# Patient Record
Sex: Male | Born: 1975 | Race: White | Hispanic: No | Marital: Single | Smoking: Current every day smoker
Health system: Southern US, Community
[De-identification: ages and names within clinical notes are randomized; demographics above are authoritative.]

## PROBLEM LIST (undated history)

## (undated) DIAGNOSIS — M549 Dorsalgia, unspecified: Secondary | ICD-10-CM

## (undated) HISTORY — PX: FRACTURE SURGERY: SHX138

---

## 2001-02-25 ENCOUNTER — Emergency Department (HOSPITAL_COMMUNITY): Admission: EM | Admit: 2001-02-25 | Discharge: 2001-02-25 | Payer: Self-pay | Admitting: Emergency Medicine

## 2001-03-09 ENCOUNTER — Emergency Department (HOSPITAL_COMMUNITY): Admission: EM | Admit: 2001-03-09 | Discharge: 2001-03-09 | Payer: Self-pay | Admitting: Internal Medicine

## 2002-06-01 ENCOUNTER — Encounter: Payer: Self-pay | Admitting: Emergency Medicine

## 2002-06-01 ENCOUNTER — Emergency Department (HOSPITAL_COMMUNITY): Admission: EM | Admit: 2002-06-01 | Discharge: 2002-06-01 | Payer: Self-pay | Admitting: Emergency Medicine

## 2003-02-23 ENCOUNTER — Emergency Department (HOSPITAL_COMMUNITY): Admission: EM | Admit: 2003-02-23 | Discharge: 2003-02-23 | Payer: Self-pay | Admitting: Emergency Medicine

## 2003-02-23 ENCOUNTER — Encounter: Payer: Self-pay | Admitting: *Deleted

## 2004-02-01 ENCOUNTER — Emergency Department (HOSPITAL_COMMUNITY): Admission: EM | Admit: 2004-02-01 | Discharge: 2004-02-01 | Payer: Self-pay | Admitting: Emergency Medicine

## 2004-02-14 ENCOUNTER — Emergency Department (HOSPITAL_COMMUNITY): Admission: EM | Admit: 2004-02-14 | Discharge: 2004-02-14 | Payer: Self-pay | Admitting: Emergency Medicine

## 2004-04-07 ENCOUNTER — Emergency Department (HOSPITAL_COMMUNITY): Admission: EM | Admit: 2004-04-07 | Discharge: 2004-04-07 | Payer: Self-pay | Admitting: Emergency Medicine

## 2004-04-12 ENCOUNTER — Emergency Department (HOSPITAL_COMMUNITY): Admission: EM | Admit: 2004-04-12 | Discharge: 2004-04-12 | Payer: Self-pay | Admitting: *Deleted

## 2004-04-14 ENCOUNTER — Ambulatory Visit (HOSPITAL_COMMUNITY): Admission: RE | Admit: 2004-04-14 | Discharge: 2004-04-14 | Payer: Self-pay | Admitting: Orthopedic Surgery

## 2004-04-19 ENCOUNTER — Emergency Department (HOSPITAL_COMMUNITY): Admission: EM | Admit: 2004-04-19 | Discharge: 2004-04-19 | Payer: Self-pay | Admitting: Emergency Medicine

## 2004-04-23 ENCOUNTER — Emergency Department (HOSPITAL_COMMUNITY): Admission: EM | Admit: 2004-04-23 | Discharge: 2004-04-23 | Payer: Self-pay | Admitting: Emergency Medicine

## 2004-04-25 ENCOUNTER — Encounter: Admission: RE | Admit: 2004-04-25 | Discharge: 2004-07-24 | Payer: Self-pay | Admitting: Orthopedic Surgery

## 2004-05-04 ENCOUNTER — Emergency Department (HOSPITAL_COMMUNITY): Admission: EM | Admit: 2004-05-04 | Discharge: 2004-05-04 | Payer: Self-pay | Admitting: Emergency Medicine

## 2004-06-30 ENCOUNTER — Emergency Department (HOSPITAL_COMMUNITY): Admission: EM | Admit: 2004-06-30 | Discharge: 2004-06-30 | Payer: Self-pay | Admitting: Emergency Medicine

## 2005-01-24 ENCOUNTER — Emergency Department (HOSPITAL_COMMUNITY): Admission: EM | Admit: 2005-01-24 | Discharge: 2005-01-24 | Payer: Self-pay | Admitting: Emergency Medicine

## 2005-01-30 ENCOUNTER — Emergency Department (HOSPITAL_COMMUNITY): Admission: EM | Admit: 2005-01-30 | Discharge: 2005-01-30 | Payer: Self-pay | Admitting: Emergency Medicine

## 2005-02-06 ENCOUNTER — Emergency Department (HOSPITAL_COMMUNITY): Admission: EM | Admit: 2005-02-06 | Discharge: 2005-02-06 | Payer: Self-pay | Admitting: Emergency Medicine

## 2006-09-25 ENCOUNTER — Emergency Department (HOSPITAL_COMMUNITY): Admission: EM | Admit: 2006-09-25 | Discharge: 2006-09-25 | Payer: Self-pay | Admitting: Emergency Medicine

## 2006-10-01 ENCOUNTER — Emergency Department (HOSPITAL_COMMUNITY): Admission: EM | Admit: 2006-10-01 | Discharge: 2006-10-01 | Payer: Self-pay | Admitting: Emergency Medicine

## 2006-10-11 ENCOUNTER — Emergency Department (HOSPITAL_COMMUNITY): Admission: EM | Admit: 2006-10-11 | Discharge: 2006-10-11 | Payer: Self-pay | Admitting: Emergency Medicine

## 2006-11-14 ENCOUNTER — Emergency Department (HOSPITAL_COMMUNITY): Admission: EM | Admit: 2006-11-14 | Discharge: 2006-11-14 | Payer: Self-pay | Admitting: Emergency Medicine

## 2008-03-01 ENCOUNTER — Emergency Department (HOSPITAL_COMMUNITY): Admission: EM | Admit: 2008-03-01 | Discharge: 2008-03-01 | Payer: Self-pay | Admitting: Emergency Medicine

## 2008-09-21 ENCOUNTER — Emergency Department (HOSPITAL_COMMUNITY): Admission: EM | Admit: 2008-09-21 | Discharge: 2008-09-21 | Payer: Self-pay | Admitting: Emergency Medicine

## 2009-07-05 ENCOUNTER — Emergency Department (HOSPITAL_COMMUNITY): Admission: EM | Admit: 2009-07-05 | Discharge: 2009-07-06 | Payer: Self-pay | Admitting: Emergency Medicine

## 2010-11-04 NOTE — Op Note (Signed)
Brandon Hunt, Brandon Hunt            ACCOUNT NO.:  [id number]   MEDICAL RECORD NO.:  [id number]          PATIENT TYPE:  AMB   LOCATION:  DAY                          FACILITY:  [facility]   PHYSICIAN:  Cindee Salt, M.D.       DATE OF BIRTH:  04/18/1976   DATE OF PROCEDURE:  04/14/2004  DATE OF DISCHARGE:                                 OPERATIVE REPORT   PREOPERATIVE DIAGNOSIS:  Dislocation, carpal metacarpal joint, right little  finger.   POSTOPERATIVE DIAGNOSIS:  Dislocation, carpal metacarpal joint, right little  finger.   OPERATION:  Closed reduction with percutaneous pinning, carpal metacarpal  joints, right ring and little fingers.   SURGEON:  Cindee Salt, M.D.   ASSISTANTCarolyne Fiscal.   ANESTHESIA:  General.   HISTORY:  The patient is a 35 year old male who suffered a dislocation to  the [facility] joints of his ring and little fingers, right hand, in a fall.  He is  brought to the operating room for closed reduction of percutaneous pinning.   PROCEDURE:  Patient is brought to the operating room.  A general anesthetic  was carried out without difficulty.  He was prepped using Duraprep in the  supine position.  The left arm was free.  The carpal metacarpal joints were  easily reduced without difficulty and pinned with two 3-5 K wires, one in  the ring and one in the little metacarpal into the hamate each.  AP lateral  and oblique x-rays revealed the joints to be well aligned and the pins in  good position.  These were capped.  A sterile dorsopalmar splint applied.  Patient tolerated the procedure well and was taken to the recovery room for  observation in satisfactory condition.  He is discharged home.  ____________  one week on Percocet.      GK/MEDQ  D:  04/14/2004  T:  04/14/2004  Job:  [id number]

## 2010-12-27 DIAGNOSIS — M542 Cervicalgia: Secondary | ICD-10-CM | POA: Insufficient documentation

## 2010-12-27 DIAGNOSIS — F17200 Nicotine dependence, unspecified, uncomplicated: Secondary | ICD-10-CM | POA: Insufficient documentation
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
## 2010-12-28 ENCOUNTER — Emergency Department (HOSPITAL_COMMUNITY)
Admission: EM | Admit: 2010-12-28 | Discharge: 2010-12-28 | Disposition: A | Discharge status: Home / Self Care | Payer: Self-pay | Attending: Emergency Medicine | Admitting: Emergency Medicine

## 2010-12-28 ENCOUNTER — Encounter (HOSPITAL_COMMUNITY): Payer: Self-pay

## 2010-12-28 ENCOUNTER — Emergency Department (HOSPITAL_COMMUNITY): Payer: Self-pay

## 2010-12-28 DIAGNOSIS — M542 Cervicalgia: Secondary | ICD-10-CM

## 2010-12-28 MED ORDER — IBUPROFEN 800 MG PO TABS
800.0000 mg | ORAL_TABLET | Freq: Once | ORAL | Status: AC
  Administered 2010-12-28: 800 mg via ORAL
  Filled 2010-12-28: qty 1

## 2010-12-28 MED ORDER — IBUPROFEN 800 MG PO TABS: 800.0000 mg | ORAL_TABLET | Freq: Three times a day (TID) | ORAL | Status: AC

## 2010-12-28 MED ORDER — HYDROCODONE-ACETAMINOPHEN 5-500 MG PO TABS: 1.0000 | ORAL_TABLET | ORAL | Status: AC | PRN

## 2010-12-28 MED ORDER — HYDROCODONE-ACETAMINOPHEN 5-325 MG PO TABS
2.0000 | ORAL_TABLET | Freq: Once | ORAL | Status: AC
  Administered 2010-12-28: 2 via ORAL
  Filled 2010-12-28: qty 2
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
## 2010-12-28 NOTE — ED Notes (Signed)
Sleeping soundly 

## 2010-12-28 NOTE — ED Notes (Signed)
Pt called to place and room and had removed c-collar while sitting in waiting room

## 2010-12-28 NOTE — ED Notes (Signed)
Review medication for reason to be given.  Review discharge instructions with understanding.  Family present to drive patient d/t pain med given.

## 2011-06-05 ENCOUNTER — Emergency Department (HOSPITAL_COMMUNITY)
Admission: EM | Admit: 2011-06-05 | Discharge: 2011-06-05 | Disposition: A | Discharge status: Home / Self Care | Payer: Self-pay | Attending: Emergency Medicine | Admitting: Emergency Medicine

## 2011-06-05 ENCOUNTER — Encounter (HOSPITAL_COMMUNITY): Payer: Self-pay

## 2011-06-05 DIAGNOSIS — M5431 Sciatica, right side: Secondary | ICD-10-CM

## 2011-06-05 DIAGNOSIS — M5430 Sciatica, unspecified side: Secondary | ICD-10-CM | POA: Insufficient documentation

## 2011-06-05 DIAGNOSIS — M545 Low back pain, unspecified: Secondary | ICD-10-CM | POA: Insufficient documentation

## 2011-06-05 DIAGNOSIS — M79609 Pain in unspecified limb: Secondary | ICD-10-CM | POA: Insufficient documentation

## 2011-06-05 DIAGNOSIS — F17200 Nicotine dependence, unspecified, uncomplicated: Secondary | ICD-10-CM | POA: Insufficient documentation

## 2011-06-05 HISTORY — DX: Dorsalgia, unspecified: M54.9

## 2011-06-05 MED ORDER — OXYCODONE-ACETAMINOPHEN 5-325 MG PO TABS
2.0000 | ORAL_TABLET | Freq: Once | ORAL | Status: AC
  Administered 2011-06-05: 2 via ORAL
  Filled 2011-06-05: qty 2

## 2011-06-05 MED ORDER — IBUPROFEN 800 MG PO TABS
800.0000 mg | ORAL_TABLET | Freq: Once | ORAL | Status: AC
  Administered 2011-06-05: 800 mg via ORAL
  Filled 2011-06-05: qty 1

## 2011-06-05 MED ORDER — IBUPROFEN 600 MG PO TABS: 600.0000 mg | ORAL_TABLET | Freq: Four times a day (QID) | ORAL | Status: AC | PRN

## 2011-06-05 MED ORDER — CYCLOBENZAPRINE HCL 5 MG PO TABS: 5.0000 mg | ORAL_TABLET | Freq: Three times a day (TID) | ORAL | Status: AC | PRN

## 2011-06-05 MED ORDER — OXYCODONE-ACETAMINOPHEN 5-325 MG PO TABS: 1.0000 | ORAL_TABLET | ORAL | Status: AC | PRN

## 2011-06-05 MED ORDER — IBUPROFEN 800 MG PO TABS
ORAL_TABLET | ORAL | Status: AC
  Filled 2011-06-05: qty 1

## 2011-06-05 NOTE — ED Notes (Signed)
Pt dropped Ibuprofen, replacement pulled on override.

## 2011-06-05 NOTE — ED Provider Notes (Signed)
History     CSN: [id number] Arrival date & time: 06/05/2011  6:07 PM   First MD Initiated Contact with Patient 06/05/11 1807      Chief Complaint  Patient presents with  . Back Pain    (Consider location/radiation/quality/duration/timing/severity/associated sxs/prior treatment) Patient is a 35 y.o. male presenting with back pain. The history is provided by the patient and the spouse.  Back Pain  The current episode started yesterday. The problem occurs constantly. The problem has been gradually worsening. The pain is associated with no known injury (But he does reports previous episodes of sciatica). The pain is present in the lumbar spine. The quality of the pain is described as shooting and stabbing. The pain radiates to the right thigh. The pain is at a severity of 10/10. The pain is severe. The symptoms are aggravated by bending, twisting and certain positions. The pain is worse during the day. Pertinent negatives include no chest pain, no fever, no numbness, no headaches, no abdominal pain, no abdominal swelling, no bowel incontinence, no perianal numbness, no dysuria, no paresthesias, no paresis, no tingling and no weakness. He has tried bed rest, analgesics and muscle relaxants for the symptoms. The treatment provided mild (fleeting relief from oxycodone and zanaflex borrowed from wife) relief.    Past Medical History  Diagnosis Date  . Back pain     History reviewed. No pertinent past surgical history.  No family history on file.  History  Substance Use Topics  . Smoking status: Current Everyday Smoker  . Smokeless tobacco: Not on file  . Alcohol Use: No      Review of Systems  Constitutional: Negative for fever.  HENT: Negative for congestion, sore throat and neck pain.   Eyes: Negative.   Respiratory: Negative for chest tightness and shortness of breath.   Cardiovascular: Negative for chest pain.  Gastrointestinal: Negative for nausea, abdominal pain and bowel  incontinence.  Genitourinary: Negative.  Negative for dysuria.  Musculoskeletal: Positive for back pain. Negative for joint swelling and arthralgias.  Skin: Negative.  Negative for rash and wound.  Neurological: Negative for dizziness, tingling, weakness, light-headedness, numbness, headaches and paresthesias.  Hematological: Negative.   Psychiatric/Behavioral: Negative.     Allergies  Review of patient's allergies indicates no known allergies.  Home Medications  No current outpatient prescriptions on file.  BP 114/66  Pulse 67  Temp(Src) 97.9 F (36.6 C) (Oral)  Resp 18  Ht 5\' 7"  (1.702 m)  Wt 140 lb (63.504 kg)  BMI 21.93 kg/m2  SpO2 99%  Physical Exam  Nursing note and vitals reviewed. Constitutional: He is oriented to person, place, and time. He appears well-developed and well-nourished.  HENT:  Head: Normocephalic.  Eyes: Conjunctivae are normal.  Neck: Normal range of motion. Neck supple.  Cardiovascular: Regular rhythm and intact distal pulses.        Pedal pulses normal.  Pulmonary/Chest: Effort normal. He has no wheezes.  Abdominal: Soft. Bowel sounds are normal. He exhibits no distension and no mass.  Musculoskeletal: Normal range of motion. He exhibits no edema.       Lumbar back: He exhibits tenderness. He exhibits no swelling, no edema and no spasm.  Neurological: He is alert and oriented to person, place, and time. He has normal strength. He displays no atrophy and no tremor. No cranial nerve deficit or sensory deficit. Gait normal.  Reflex Scores:      Patellar reflexes are 2+ on the right side and 2+ on the left side.  Achilles reflexes are 2+ on the right side and 2+ on the left side.      No strength deficit noted in hip and knee flexor and extensor muscle groups.  Ankle flexion and extension intact.  Skin: Skin is warm and dry.  Psychiatric: He has a normal mood and affect.    ED Course  Procedures (including critical care time)  Labs Reviewed  - No data to display No results found.   No diagnosis found.    MDM  Acute intermittent sciatica.  No neurologic findings suggestive of emergent neurologic process.        Candis Musa, PA 06/05/11 1944

## 2011-06-05 NOTE — ED Notes (Signed)
Pt reports has sciatica and it flares up intermittently.  Reports this time pain started yesterday.  States pain is in the center of his lower back nonradiating.

## 2011-06-07 NOTE — ED Provider Notes (Signed)
Medical screening examination/treatment/procedure(s) were performed by non-physician practitioner and as supervising physician I was immediately available for consultation/collaboration.   Shelda Jakes, MD 06/07/11 [phone]

## 2011-07-03 ENCOUNTER — Encounter (HOSPITAL_COMMUNITY): Payer: Self-pay | Admitting: *Deleted

## 2011-07-03 ENCOUNTER — Emergency Department (HOSPITAL_COMMUNITY)
Admission: EM | Admit: 2011-07-03 | Discharge: 2011-07-03 | Disposition: A | Discharge status: Home / Self Care | Payer: Self-pay | Attending: Emergency Medicine | Admitting: Emergency Medicine

## 2011-07-03 DIAGNOSIS — F17200 Nicotine dependence, unspecified, uncomplicated: Secondary | ICD-10-CM | POA: Insufficient documentation

## 2011-07-03 DIAGNOSIS — G8929 Other chronic pain: Secondary | ICD-10-CM | POA: Insufficient documentation

## 2011-07-03 DIAGNOSIS — M5380 Other specified dorsopathies, site unspecified: Secondary | ICD-10-CM | POA: Insufficient documentation

## 2011-07-03 DIAGNOSIS — M549 Dorsalgia, unspecified: Secondary | ICD-10-CM | POA: Insufficient documentation

## 2011-07-03 MED ORDER — DEXAMETHASONE 4 MG PO TABS: 4.0000 mg | ORAL_TABLET | Freq: Two times a day (BID) | ORAL | Status: AC

## 2011-07-03 MED ORDER — TIZANIDINE HCL 4 MG PO CAPS: 4.0000 mg | ORAL_CAPSULE | Freq: Three times a day (TID) | ORAL | Status: DC

## 2011-07-03 MED ORDER — HYDROCODONE-ACETAMINOPHEN 5-325 MG PO TABS: ORAL_TABLET | ORAL | Status: DC

## 2011-07-03 NOTE — ED Notes (Signed)
C/o lower back pain (chronic); states was seen 1 month ago here for same; states the pain returned after he ran out of pain medications; reports has appt to see specialist, but appt is not until next month.

## 2011-07-03 NOTE — ED Provider Notes (Signed)
History     CSN: [id number]  Arrival date & time 07/03/11  1637   First MD Initiated Contact with Patient 07/03/11 1650      Chief Complaint  Patient presents with  . Back Pain    (Consider location/radiation/quality/duration/timing/severity/associated sxs/prior treatment) HPI Comments:   Patient states he has chronic low back pain. He is scheduled to see a back and pain specialist, but could not be scheduled until next month. The patient was seen here less than a month ago for the same back problem. The patient ran out of medications and has not seen a primary care physician and he presents now for assistance with his back pain. No recent injury or other complication reported. Patient denies loss of bowel or bladder function.  Patient is a 36 y.o. male presenting with back pain. The history is provided by the patient.  Back Pain  Pertinent negatives include no chest pain, no abdominal pain and no dysuria.    Past Medical History  Diagnosis Date  . Back pain     History reviewed. No pertinent past surgical history.  No family history on file.  History  Substance Use Topics  . Smoking status: Current Everyday Smoker -- 1.0 packs/day    Types: Cigarettes  . Smokeless tobacco: Not on file  . Alcohol Use: No      Review of Systems  Constitutional: Negative for activity change.       All ROS Neg except as noted in HPI  HENT: Negative for nosebleeds and neck pain.   Eyes: Negative for photophobia and discharge.  Respiratory: Negative for cough, shortness of breath and wheezing.   Cardiovascular: Negative for chest pain and palpitations.  Gastrointestinal: Negative for abdominal pain and blood in stool.  Genitourinary: Negative for dysuria, frequency and hematuria.  Musculoskeletal: Positive for back pain. Negative for arthralgias.  Skin: Negative.   Neurological: Negative for dizziness, seizures and speech difficulty.  Psychiatric/Behavioral: Negative for  hallucinations and confusion.    Allergies  Review of patient's allergies indicates no known allergies.  Home Medications   Current Outpatient Rx  Name Route Sig Dispense Refill  . DEXAMETHASONE 4 MG PO TABS Oral Take 1 tablet (4 mg total) by mouth 2 (two) times daily with a meal. 12 tablet 0  . HYDROCODONE-ACETAMINOPHEN 5-325 MG PO TABS  1 or 2 po q4h prn pain 24 tablet 0  . TIZANIDINE HCL 4 MG PO CAPS Oral Take 1 capsule (4 mg total) by mouth 3 (three) times daily. 21 capsule 0    BP 127/82  Pulse 84  Temp(Src) 98.4 F (36.9 C) (Oral)  Resp 18  Ht 5\' 7"  (1.702 m)  Wt 138 lb (62.596 kg)  BMI 21.61 kg/m2  SpO2 100%  Physical Exam  Nursing note and vitals reviewed. Constitutional: He is oriented to person, place, and time. He appears well-developed and well-nourished.  Non-toxic appearance.  HENT:  Head: Normocephalic.  Right Ear: Tympanic membrane and external ear normal.  Left Ear: Tympanic membrane and external ear normal.  Eyes: EOM and lids are normal. Pupils are equal, round, and reactive to light.  Neck: Normal range of motion. Neck supple. Carotid bruit is not present.  Cardiovascular: Normal rate, regular rhythm, normal heart sounds, intact distal pulses and normal pulses.   Pulmonary/Chest: Breath sounds normal. No respiratory distress.  Abdominal: Soft. Bowel sounds are normal. There is no tenderness. There is no guarding.  Musculoskeletal: Normal range of motion.       Lower  lumbar area pain to palpation and attempted range of motion  Lymphadenopathy:       Head (right side): No submandibular adenopathy present.       Head (left side): No submandibular adenopathy present.    He has no cervical adenopathy.  Neurological: He is alert and oriented to person, place, and time. He has normal strength. No cranial nerve deficit or sensory deficit.  Skin: Skin is warm and dry.  Psychiatric: He has a normal mood and affect. His speech is normal.    ED Course    Procedures (including critical care time)  Labs Reviewed - No data to display No results found.   1. Back pain, chronic       MDM  I have reviewed nursing notes, vital signs, and all appropriate lab and imaging results for this patient. History and examination suggests patient having an exacerbation of chronic back pain. Prescription for Decadron 4 mg twice a day, Norco every 4 hours as needed for pain, and Zanaflex 4 mg 3 times a day for spasm given to the patient.       Kathie Dike, [place] 07/03/11 2148

## 2011-07-03 NOTE — ED Notes (Signed)
MD at bedside. 

## 2011-07-03 NOTE — ED Provider Notes (Signed)
Medical screening examination/treatment/procedure(s) were performed by non-physician practitioner and as supervising physician I was immediately available for consultation/collaboration.  Corrisa Gibby, MD 07/03/11 2242 

## 2011-11-06 ENCOUNTER — Encounter (HOSPITAL_COMMUNITY): Payer: Self-pay | Admitting: *Deleted

## 2011-11-06 ENCOUNTER — Emergency Department (HOSPITAL_COMMUNITY)
Admission: EM | Admit: 2011-11-06 | Discharge: 2011-11-06 | Disposition: A | Discharge status: Home / Self Care | Payer: Self-pay | Attending: Emergency Medicine | Admitting: Emergency Medicine

## 2011-11-06 DIAGNOSIS — G8929 Other chronic pain: Secondary | ICD-10-CM | POA: Insufficient documentation

## 2011-11-06 DIAGNOSIS — F17200 Nicotine dependence, unspecified, uncomplicated: Secondary | ICD-10-CM | POA: Insufficient documentation

## 2011-11-06 DIAGNOSIS — M5430 Sciatica, unspecified side: Secondary | ICD-10-CM | POA: Insufficient documentation

## 2011-11-06 MED ORDER — DEXAMETHASONE SODIUM PHOSPHATE 4 MG/ML IJ SOLN
8.0000 mg | Freq: Once | INTRAMUSCULAR | Status: AC
  Administered 2011-11-06: 8 mg via INTRAMUSCULAR
  Filled 2011-11-06: qty 2

## 2011-11-06 MED ORDER — MORPHINE SULFATE 4 MG/ML IJ SOLN
8.0000 mg | Freq: Once | INTRAMUSCULAR | Status: AC
  Administered 2011-11-06: 8 mg via INTRAMUSCULAR
  Filled 2011-11-06: qty 2

## 2011-11-06 MED ORDER — DEXAMETHASONE 4 MG PO TABS: ORAL_TABLET | ORAL | Status: AC

## 2011-11-06 MED ORDER — ONDANSETRON 4 MG PO TBDP
4.0000 mg | ORAL_TABLET | Freq: Once | ORAL | Status: AC
  Administered 2011-11-06: 4 mg via ORAL
  Filled 2011-11-06: qty 1

## 2011-11-06 MED ORDER — METHOCARBAMOL 500 MG PO TABS
1000.0000 mg | ORAL_TABLET | Freq: Once | ORAL | Status: AC
  Administered 2011-11-06: 1000 mg via ORAL
  Filled 2011-11-06: qty 2

## 2011-11-06 MED ORDER — OXYCODONE-ACETAMINOPHEN 5-325 MG PO TABS: 1.0000 | ORAL_TABLET | Freq: Four times a day (QID) | ORAL | Status: AC | PRN

## 2011-11-06 MED ORDER — BACLOFEN 10 MG PO TABS: 10.0000 mg | ORAL_TABLET | Freq: Three times a day (TID) | ORAL | Status: AC

## 2011-11-06 MED ORDER — MORPHINE SULFATE 2 MG/ML IJ SOLN: 8.0000 mg | Freq: Once | INTRAMUSCULAR | Status: DC

## 2011-11-06 NOTE — ED Notes (Signed)
Low back pain, no injury

## 2011-11-06 NOTE — ED Notes (Signed)
Pt with no adverse effects from injections. Able to ambulate to bathroom. Left with spouse.

## 2011-11-06 NOTE — Discharge Instructions (Signed)
Sciatica  Sciatica is a condition often seen in patients with disk disease of the lower back. Pressure on the sciatica nerve causes pain to radiate from the lower back or buttock down the leg.   CAUSES   It results from pressure on nerve roots coming out of the spine. This is often the result of a disc that deteriorates and pushes to one side. Often there is a history of back problems.   TREATMENT   In most cases sciatica improves greatly with conservative treatment (treatment that does not involve surgery). Most patients are completely better after 2-4 weeks of bed rest and other supportive care. Bed rest reduces the disc pressure greatly. Sitting is the worst position. When sitting pressure on the disc is over 5 times greater than it is while lying down. Avoid:   Bending.   Lifting.   All other activities which make the problem worse.  After the pain improves, you may continue with normal activity. Take brief periods for bed rest throughout the day until you are back to normal.  Only take over-the-counter or prescription medicines for pain, discomfort, or fever as directed by your caregiver. Muscle relaxants may help by relieving spasm and providing mild sedation. Cold or heat therapy and massage may also give significant relief. Spinal manipulation is not recommended because it can increase the degree of disc protrusion. Traction can be used in severe cases. Surgery is reserved for patients who:   Do not improve within the first months of conservative treatment.   Have signs of severe nerve root pressure.  See your doctor for follow up care as recommended. A program for back injury rehabilitation with stretching and strengthening exercises is an important part of healing.   SEEK MEDICAL CARE IF:    You notice increased pain.   You notice weakness.   You have numbness in your legs.   You have any difficulty with bladder or bowel control.  Document Released: 07/13/2004 Document Revised: 05/25/2011 Document  Reviewed: 06/05/2005  [employer] Patient Information 2012 [employer].

## 2011-11-06 NOTE — ED Provider Notes (Signed)
History     CSN: [id number]  Arrival date & time 11/06/11  1653   First MD Initiated Contact with Patient 11/06/11 1800      Chief Complaint  Patient presents with  . Back Pain    (Consider location/radiation/quality/duration/timing/severity/associated sxs/prior treatment) The history is provided by the patient.    Past Medical History  Diagnosis Date  . Back pain     Past Surgical History  Procedure Date  . Fracture surgery     History reviewed. No pertinent family history.  History  Substance Use Topics  . Smoking status: Current Everyday Smoker -- 1.0 packs/day    Types: Cigarettes  . Smokeless tobacco: Not on file  . Alcohol Use: Yes      Review of Systems  Constitutional: Negative for activity change.       All ROS Neg except as noted in HPI  HENT: Negative for nosebleeds and neck pain.   Eyes: Negative for photophobia and discharge.  Respiratory: Negative for cough, shortness of breath and wheezing.   Cardiovascular: Negative for chest pain and palpitations.  Gastrointestinal: Negative for abdominal pain and blood in stool.  Genitourinary: Negative for dysuria, frequency and hematuria.  Musculoskeletal: Negative for back pain and arthralgias.  Skin: Negative.   Neurological: Negative for dizziness, seizures and speech difficulty.  Psychiatric/Behavioral: Negative for hallucinations and confusion.    Allergies  Review of patient's allergies indicates no known allergies.  Home Medications   Current Outpatient Rx  Name Route Sig Dispense Refill  . IBUPROFEN 200 MG PO TABS Oral Take 1,600 mg by mouth as needed. For pain      BP 114/77  Pulse 72  Temp(Src) 97.9 F (36.6 C) (Oral)  Resp 20  Ht 5\' 7"  (1.702 m)  Wt 135 lb (61.236 kg)  BMI 21.14 kg/m2  SpO2 100%  Physical Exam  Nursing note and vitals reviewed. Constitutional: He is oriented to person, place, and time. He appears well-developed and well-nourished.  Non-toxic appearance.    HENT:  Head: Normocephalic.  Right Ear: Tympanic membrane and external ear normal.  Left Ear: Tympanic membrane and external ear normal.  Eyes: EOM and lids are normal. Pupils are equal, round, and reactive to light.  Neck: Normal range of motion. Neck supple. Carotid bruit is not present.  Cardiovascular: Normal rate, regular rhythm, normal heart sounds, intact distal pulses and normal pulses.   Pulmonary/Chest: Breath sounds normal. No respiratory distress.  Abdominal: Soft. Bowel sounds are normal. There is no tenderness. There is no guarding.  Musculoskeletal:       Lower lumbar area pain to palpation and with attempted ROM of the lower back. Back pain stimulated with leg movement.  Lymphadenopathy:       Head (right side): No submandibular adenopathy present.       Head (left side): No submandibular adenopathy present.    He has no cervical adenopathy.  Neurological: He is alert and oriented to person, place, and time. He has normal strength. No cranial nerve deficit or sensory deficit. He exhibits normal muscle tone. Coordination normal.  Skin: Skin is warm and dry.  Psychiatric: He has a normal mood and affect. His speech is normal.    ED Course  Procedures (including critical care time)  Labs Reviewed - No data to display No results found.   No diagnosis found.    MDM  I have reviewed nursing notes, vital signs, and all appropriate lab and imaging results for this patient. Pt has chronic  pain with sciatica. He does not have a primary MD. Rx for blacofen, decadron, and percocet  # 15 given to the patient.       Kathie Dike, [place] 11/06/11 1901

## 2011-11-06 NOTE — ED Provider Notes (Signed)
Medical screening examination/treatment/procedure(s) were performed by non-physician practitioner and as supervising physician I was immediately available for consultation/collaboration. Devoria Albe, MD, Armando Gang   Ward Givens, MD 11/06/11 [phone]

## 2011-12-14 ENCOUNTER — Emergency Department (HOSPITAL_COMMUNITY)
Admission: EM | Admit: 2011-12-14 | Discharge: 2011-12-14 | Disposition: A | Discharge status: Home / Self Care | Payer: Self-pay | Attending: Emergency Medicine | Admitting: Emergency Medicine

## 2011-12-14 ENCOUNTER — Encounter (HOSPITAL_COMMUNITY): Payer: Self-pay

## 2011-12-14 DIAGNOSIS — M545 Low back pain, unspecified: Secondary | ICD-10-CM | POA: Insufficient documentation

## 2011-12-14 DIAGNOSIS — F17200 Nicotine dependence, unspecified, uncomplicated: Secondary | ICD-10-CM | POA: Insufficient documentation

## 2011-12-14 DIAGNOSIS — M79606 Pain in leg, unspecified: Secondary | ICD-10-CM

## 2011-12-14 MED ORDER — METHYLPREDNISOLONE SODIUM SUCC 125 MG IJ SOLR
125.0000 mg | Freq: Once | INTRAMUSCULAR | Status: AC
  Administered 2011-12-14: 125 mg via INTRAMUSCULAR
  Filled 2011-12-14: qty 2

## 2011-12-14 MED ORDER — CYCLOBENZAPRINE HCL 10 MG PO TABS: 10.0000 mg | ORAL_TABLET | Freq: Three times a day (TID) | ORAL | Status: AC | PRN

## 2011-12-14 MED ORDER — HYDROMORPHONE HCL PF 1 MG/ML IJ SOLN
1.0000 mg | Freq: Once | INTRAMUSCULAR | Status: AC
  Administered 2011-12-14: 1 mg via INTRAMUSCULAR
  Filled 2011-12-14: qty 1

## 2011-12-14 MED ORDER — NAPROXEN 500 MG PO TABS: 500.0000 mg | ORAL_TABLET | Freq: Two times a day (BID) | ORAL | Status: DC

## 2011-12-14 MED ORDER — HYDROCODONE-ACETAMINOPHEN 5-325 MG PO TABS: 1.0000 | ORAL_TABLET | Freq: Four times a day (QID) | ORAL | Status: AC | PRN

## 2011-12-14 MED ORDER — PREDNISONE 10 MG PO TABS: 20.0000 mg | ORAL_TABLET | Freq: Every day | ORAL | Status: DC

## 2011-12-14 NOTE — ED Notes (Signed)
Right flank and lower leg and back pain, onset last pm history of sciatica and feels the same.

## 2011-12-14 NOTE — Discharge Instructions (Signed)
Follow up with your md next week if not improving °

## 2011-12-14 NOTE — ED Provider Notes (Signed)
History   This chart was scribed for Benny Lennert, MD by Sofie Rower. The patient was seen in room TR08C/TR08C and the patient's care was started at 5:58 PM     CSN: [id number]  Arrival date & time 12/14/11  1624   None     Chief Complaint  Patient presents with  . Sciatica    (Consider location/radiation/quality/duration/timing/severity/associated sxs/prior treatment) Patient is a 36 y.o. male presenting with back pain. The history is provided by the patient. No language interpreter was used.  Back Pain  This is a recurrent problem. The current episode started yesterday. The problem occurs rarely. The problem has not changed since onset.The pain is associated with lifting heavy objects. The pain is present in the lumbar spine. The quality of the pain is described as shooting. The pain radiates to the right thigh. The pain is moderate. The symptoms are aggravated by certain positions. The pain is the same all the time. Associated symptoms include leg pain. Pertinent negatives include no chest pain, no headaches, no abdominal pain and no weakness. He has tried nothing for the symptoms. The treatment provided no relief.    Past Medical History  Diagnosis Date  . Back pain     Past Surgical History  Procedure Date  . Fracture surgery      History  Substance Use Topics  . Smoking status: Current Everyday Smoker -- 1.0 packs/day    Types: Cigarettes  . Smokeless tobacco: Not on file  . Alcohol Use: Yes      Review of Systems  Constitutional: Negative for fatigue.  HENT: Negative for congestion, sinus pressure and ear discharge.   Eyes: Negative for discharge.  Respiratory: Negative for cough.   Cardiovascular: Negative for chest pain.  Gastrointestinal: Negative for abdominal pain and diarrhea.  Genitourinary: Negative for frequency and hematuria.  Musculoskeletal: Positive for back pain.  Skin: Negative for rash.  Neurological: Negative for seizures, weakness and  headaches.  Hematological: Negative.   Psychiatric/Behavioral: Negative for hallucinations.    Allergies  Review of patient's allergies indicates no known allergies.  Home Medications   Current Outpatient Rx  Name Route Sig Dispense Refill  . IBUPROFEN 200 MG PO TABS Oral Take 800 mg by mouth as needed. For pain      BP 124/85  Pulse 74  Temp 98.3 F (36.8 C) (Oral)  Resp 19  SpO2 98%  Physical Exam  Nursing note and vitals reviewed. Constitutional: He is oriented to person, place, and time. He appears well-developed.  HENT:  Head: Normocephalic.  Eyes: Conjunctivae are normal.  Neck: No tracheal deviation present.  Cardiovascular:  No murmur heard. Musculoskeletal: Normal range of motion.  Neurological: He is oriented to person, place, and time.  Skin: Skin is warm.  Psychiatric: He has a normal mood and affect.    ED Course  Procedures (including critical care time)  DIAGNOSTIC STUDIES: Oxygen Saturation is 98% on room air, normal by my interpretation.    COORDINATION OF CARE:  6:00PM- EDP at bedside discusses treatment plan concerning evaluation of x-rays.   6:06PM- EDP at bedside discusses treatment plan concerning pain management.    Labs Reviewed - No data to display No results found.   No diagnosis found.    MDM      The chart was scribed for me under my direct supervision.  I personally performed the history, physical, and medical decision making and all procedures in the evaluation of this patient.Jomarie Longs  Purnell Shoemaker, MD 12/14/11 1812

## 2012-01-10 ENCOUNTER — Encounter (HOSPITAL_COMMUNITY): Payer: Self-pay | Admitting: Emergency Medicine

## 2012-01-10 ENCOUNTER — Emergency Department (HOSPITAL_COMMUNITY)
Admission: EM | Admit: 2012-01-10 | Discharge: 2012-01-10 | Disposition: A | Discharge status: Home / Self Care | Payer: Self-pay | Attending: Emergency Medicine | Admitting: Emergency Medicine

## 2012-01-10 DIAGNOSIS — K029 Dental caries, unspecified: Secondary | ICD-10-CM | POA: Insufficient documentation

## 2012-01-10 DIAGNOSIS — K089 Disorder of teeth and supporting structures, unspecified: Secondary | ICD-10-CM | POA: Insufficient documentation

## 2012-01-10 DIAGNOSIS — F17200 Nicotine dependence, unspecified, uncomplicated: Secondary | ICD-10-CM | POA: Insufficient documentation

## 2012-01-10 MED ORDER — PENICILLIN V POTASSIUM 500 MG PO TABS: 500.0000 mg | ORAL_TABLET | Freq: Four times a day (QID) | ORAL | Status: AC

## 2012-01-10 MED ORDER — OXYCODONE-ACETAMINOPHEN 5-325 MG PO TABS
1.0000 | ORAL_TABLET | Freq: Once | ORAL | Status: AC
  Administered 2012-01-10: 1 via ORAL
  Filled 2012-01-10: qty 1

## 2012-01-10 MED ORDER — PENICILLIN V POTASSIUM 250 MG PO TABS
500.0000 mg | ORAL_TABLET | Freq: Once | ORAL | Status: AC
Start: 2012-01-10 — End: 2012-01-10
  Administered 2012-01-10: 500 mg via ORAL
  Filled 2012-01-10: qty 2

## 2012-01-10 MED ORDER — OXYCODONE-ACETAMINOPHEN 5-325 MG PO TABS: 2.0000 | ORAL_TABLET | ORAL | Status: AC | PRN

## 2012-01-10 NOTE — ED Provider Notes (Signed)
History   This chart was scribed for Brandon Guppy, MD by Charolett Bumpers . The patient was seen in room TR09C/TR09C. Patient's care was started at 1805.    CSN: [id number]  Arrival date & time 01/10/12  1726   First MD Initiated Contact with Patient 01/10/12 1805      Chief Complaint  Patient presents with  . Dental Pain    (Consider location/radiation/quality/duration/timing/severity/associated sxs/prior treatment) HPI ROBBIN Hunt is a 36 y.o. male who presents to the Emergency Department complaining of constant, moderate dental pain for the past week. Pt reports that his dental pain is located on the left side of his mouth. Pt reports that he has several cavities and broken teeth and "holes in teeth". Pt denies any fevers, n/v. Pt reports associated facial pain and headache. Pt denies any prior medical problems. Pt reports that he smokes and occasional alcohol use.   Past Medical History  Diagnosis Date  . Back pain     Past Surgical History  Procedure Date  . Fracture surgery     No family history on file.  History  Substance Use Topics  . Smoking status: Current Everyday Smoker -- 1.0 packs/day    Types: Cigarettes  . Smokeless tobacco: Not on file  . Alcohol Use: Yes      Review of Systems  Constitutional: Negative for fever and chills.  HENT: Positive for dental problem.   Respiratory: Negative for shortness of breath.   Gastrointestinal: Negative for nausea and vomiting.  Neurological: Positive for headaches. Negative for weakness.    Allergies  Review of patient's allergies indicates no known allergies.  Home Medications   Current Outpatient Rx  Name Route Sig Dispense Refill  . IBUPROFEN 200 MG PO TABS Oral Take 800 mg by mouth as needed. For pain      BP 116/75  Pulse 53  Temp 98 F (36.7 C) (Oral)  Resp 16  SpO2 98%  Physical Exam  Nursing note and vitals reviewed. Constitutional: He is oriented to person, place, and  time. He appears well-developed and well-nourished. No distress.  HENT:  Head: Normocephalic and atraumatic.  Mouth/Throat: Oropharynx is clear and moist. Dental caries present.       Severe, rotten dental caries throughout.   Eyes: EOM are normal.  Neck: Neck supple. No tracheal deviation present.  Cardiovascular: Normal rate.   Pulmonary/Chest: Effort normal. No respiratory distress.  Musculoskeletal: Normal range of motion.  Neurological: He is alert and oriented to person, place, and time.  Skin: Skin is warm and dry.  Psychiatric: He has a normal mood and affect. His behavior is normal.    ED Course  Procedures (including critical care time)  DIAGNOSTIC STUDIES: Oxygen Saturation is 98% on room air, normal by my interpretation.    COORDINATION OF CARE:   18:54-Discussed planned course of treatment with the patient including pain medication, abx and f/u with dentist on call, who is agreeable at this time.   19:00-Medication Orders: Oxycodone-acetaminophen (Percocet/Roxicet) 5-325 mg per tablet 1 tablet-once; Penicillin V potassium (Veetid) tablet 500 mg-once.    Labs Reviewed - No data to display No results found.   No diagnosis found.    MDM   Dental caries   I personally performed the services described in this documentation, which was scribed in my presence. The recorded information has been reviewed and considered.    Brandon Guppy, MD 01/10/12 1910

## 2012-01-10 NOTE — ED Notes (Signed)
Pt d/c home in NAD. Pt voiced understanding of d/c instructions and need for follow up care. Pt instructed not to drive after taking percocet

## 2012-01-10 NOTE — ED Notes (Signed)
Pt states "I dont have no insurance and cant see no dentist and I have a hole in my tooth." No swelling noted to face. Pt states both bottom and top teeth hurt on left side.

## 2012-01-10 NOTE — ED Notes (Signed)
C/o L sided toothache x 1 week.

## 2012-08-11 ENCOUNTER — Emergency Department (HOSPITAL_COMMUNITY)
Admission: EM | Admit: 2012-08-11 | Discharge: 2012-08-11 | Disposition: A | Discharge status: Home / Self Care | Payer: Self-pay | Attending: Emergency Medicine | Admitting: Emergency Medicine

## 2012-08-11 ENCOUNTER — Encounter (HOSPITAL_COMMUNITY): Payer: Self-pay | Admitting: *Deleted

## 2012-08-11 DIAGNOSIS — S61219A Laceration without foreign body of unspecified finger without damage to nail, initial encounter: Secondary | ICD-10-CM

## 2012-08-11 DIAGNOSIS — Y9389 Activity, other specified: Secondary | ICD-10-CM | POA: Insufficient documentation

## 2012-08-11 DIAGNOSIS — Y929 Unspecified place or not applicable: Secondary | ICD-10-CM | POA: Insufficient documentation

## 2012-08-11 DIAGNOSIS — Z23 Encounter for immunization: Secondary | ICD-10-CM | POA: Insufficient documentation

## 2012-08-11 DIAGNOSIS — F17200 Nicotine dependence, unspecified, uncomplicated: Secondary | ICD-10-CM | POA: Insufficient documentation

## 2012-08-11 DIAGNOSIS — S61209A Unspecified open wound of unspecified finger without damage to nail, initial encounter: Secondary | ICD-10-CM | POA: Insufficient documentation

## 2012-08-11 DIAGNOSIS — W268XXA Contact with other sharp object(s), not elsewhere classified, initial encounter: Secondary | ICD-10-CM | POA: Insufficient documentation

## 2012-08-11 MED ORDER — HYDROCODONE-ACETAMINOPHEN 5-325 MG PO TABS
2.0000 | ORAL_TABLET | Freq: Once | ORAL | Status: AC
  Administered 2012-08-11: 2 via ORAL
  Filled 2012-08-11: qty 2

## 2012-08-11 MED ORDER — TETANUS-DIPHTH-ACELL PERTUSSIS 5-2.5-18.5 LF-MCG/0.5 IM SUSP
0.5000 mL | Freq: Once | INTRAMUSCULAR | Status: AC
  Administered 2012-08-11: 0.5 mL via INTRAMUSCULAR
  Filled 2012-08-11: qty 0.5

## 2012-08-11 NOTE — ED Provider Notes (Signed)
Medical screening examination/treatment/procedure(s) were performed by non-physician practitioner and as supervising physician I was immediately available for consultation/collaboration.   Sandip Power L Armel Rabbani, MD 08/11/12 2240 

## 2012-08-11 NOTE — ED Notes (Signed)
Pt has laceration to right index finger. Pt states he cut it on a vegetable can.

## 2012-08-11 NOTE — ED Notes (Signed)
Pt presents with small laceration right index finger. Bleeding controlled on arrival.

## 2012-08-11 NOTE — ED Notes (Signed)
Pt not in waiting room

## 2012-08-11 NOTE — ED Provider Notes (Signed)
History     CSN: [id number]  Arrival date & time 08/11/12  1949   First MD Initiated Contact with Patient 08/11/12 2048      Chief Complaint  Patient presents with  . Extremity Laceration    (Consider location/radiation/quality/duration/timing/severity/associated sxs/prior treatment) Patient is a 37 y.o. male presenting with skin laceration. The history is provided by the patient. No language interpreter was used.  Laceration Location:  Finger Depth:  Cutaneous Time since incident:  2 hours Laceration mechanism:  Metal edge Pain details:    Severity:  Mild Foreign body present:  Metal Tetanus status:  Out of date Pt cut finger on a can.   Pt complains of a laceration to finger  Past Medical History  Diagnosis Date  . Back pain     Past Surgical History  Procedure Laterality Date  . Fracture surgery      History reviewed. No pertinent family history.  History  Substance Use Topics  . Smoking status: Current Every Day Smoker -- 1.00 packs/day    Types: Cigarettes  . Smokeless tobacco: Not on file  . Alcohol Use: No      Review of Systems  Skin: Positive for wound.  All other systems reviewed and are negative.    Allergies  Review of patient's allergies indicates no known allergies.  Home Medications  No current outpatient prescriptions on file.  BP 112/58  Pulse 62  Temp(Src) 98.2 F (36.8 C) (Oral)  Resp 20  Ht 5\' 7"  (1.702 m)  Wt 140 lb (63.504 kg)  BMI 21.92 kg/m2  SpO2 99%  Physical Exam  Nursing note and vitals reviewed. Constitutional: He is oriented to person, place, and time. He appears well-developed and well-nourished.  Musculoskeletal: He exhibits tenderness.  Neurological: He is alert and oriented to person, place, and time. He has normal reflexes.  Skin: Skin is warm.  1cm laceration distal right index finger  Psychiatric: He has a normal mood and affect.    ED Course  LACERATION REPAIR Date/Time: 08/11/2012 9:08  PM Performed by: Cheron Schaumann K Authorized by: Elson Areas Body area: upper extremity Location details: right index finger Laceration length: 1 cm Foreign bodies: no foreign bodies Tendon involvement: none Nerve involvement: none Skin closure: glue Patient tolerance: Patient tolerated the procedure well with no immediate complications. Comments: Superficial laceration,     (including critical care time)  Labs Reviewed - No data to display No results found.   1. Laceration of finger       MDM  Pt complains of pain.   Pt given 2 hydrocodone here        Lonia Skinner [place], [place] 08/11/12 2110

## 2013-01-23 ENCOUNTER — Emergency Department (HOSPITAL_COMMUNITY)
Admission: EM | Admit: 2013-01-23 | Discharge: 2013-01-23 | Disposition: A | Discharge status: Home / Self Care | Payer: Self-pay | Attending: Emergency Medicine | Admitting: Emergency Medicine

## 2013-01-23 ENCOUNTER — Encounter (HOSPITAL_COMMUNITY): Payer: Self-pay | Admitting: Emergency Medicine

## 2013-01-23 DIAGNOSIS — K0889 Other specified disorders of teeth and supporting structures: Secondary | ICD-10-CM

## 2013-01-23 DIAGNOSIS — K089 Disorder of teeth and supporting structures, unspecified: Secondary | ICD-10-CM | POA: Insufficient documentation

## 2013-01-23 DIAGNOSIS — F17200 Nicotine dependence, unspecified, uncomplicated: Secondary | ICD-10-CM | POA: Insufficient documentation

## 2013-01-23 DIAGNOSIS — R51 Headache: Secondary | ICD-10-CM | POA: Insufficient documentation

## 2013-01-23 DIAGNOSIS — K029 Dental caries, unspecified: Secondary | ICD-10-CM | POA: Insufficient documentation

## 2013-01-23 MED ORDER — HYDROCODONE-ACETAMINOPHEN 5-325 MG PO TABS: ORAL_TABLET | ORAL | Status: DC

## 2013-01-23 MED ORDER — HYDROCODONE-ACETAMINOPHEN 5-325 MG PO TABS
1.0000 | ORAL_TABLET | Freq: Once | ORAL | Status: AC
  Administered 2013-01-23: 1 via ORAL
  Filled 2013-01-23: qty 1

## 2013-01-23 MED ORDER — AMOXICILLIN 250 MG PO CAPS
500.0000 mg | ORAL_CAPSULE | Freq: Once | ORAL | Status: AC
  Administered 2013-01-23: 500 mg via ORAL
  Filled 2013-01-23: qty 2

## 2013-01-23 MED ORDER — AMOXICILLIN 500 MG PO CAPS: 500.0000 mg | ORAL_CAPSULE | Freq: Three times a day (TID) | ORAL | Status: DC

## 2013-01-23 NOTE — ED Notes (Signed)
Patient complaining of lower right dental pain x 2 days.  

## 2013-01-23 NOTE — ED Provider Notes (Signed)
CSN: [id number]     Arrival date & time 01/23/13  2138 History     First MD Initiated Contact with Patient 01/23/13 2145     Chief Complaint  Patient presents with  . Dental Pain   (Consider location/radiation/quality/duration/timing/severity/associated sxs/prior Treatment) Patient is a 37 y.o. male presenting with tooth pain. The history is provided by the patient.  Dental Pain Location:  Lower Lower teeth location:  29/RL 2nd bicuspid Quality:  Throbbing and sharp Severity:  Moderate Onset quality:  Gradual Duration:  2 days Timing:  Constant Progression:  Worsening Chronicity:  New Context: dental caries and poor dentition   Context: not trauma   Relieved by:  Nothing Worsened by:  Hot food/drink and cold food/drink Ineffective treatments:  Topical anesthetic gel Associated symptoms: facial pain   Associated symptoms: no congestion, no difficulty swallowing, no drooling, no facial swelling, no fever, no gum swelling, no headaches, no neck pain, no neck swelling, no oral bleeding, no oral lesions and no trismus   Risk factors: lack of dental care, periodontal disease and smoking     Past Medical History  Diagnosis Date  . Back pain    Past Surgical History  Procedure Laterality Date  . Fracture surgery     History reviewed. No pertinent family history. History  Substance Use Topics  . Smoking status: Current Every Day Smoker -- 1.00 packs/day    Types: Cigarettes  . Smokeless tobacco: Not on file  . Alcohol Use: No    Review of Systems  Constitutional: Negative for fever and appetite change.  HENT: Positive for dental problem. Negative for congestion, sore throat, facial swelling, drooling, mouth sores, trouble swallowing, neck pain and neck stiffness.   Eyes: Negative for pain and visual disturbance.  Neurological: Negative for dizziness, facial asymmetry and headaches.  Hematological: Negative for adenopathy.  All other systems reviewed and are  negative.    Allergies  Review of patient's allergies indicates no known allergies.  Home Medications  No current outpatient prescriptions on file. BP 130/77  Pulse 75  Temp(Src) 98.5 F (36.9 C) (Oral)  Resp 20  Ht 5\' 7"  (1.702 m)  Wt 125 lb (56.7 kg)  BMI 19.57 kg/m2  SpO2 100% Physical Exam  Nursing note and vitals reviewed. Constitutional: He is oriented to person, place, and time. He appears well-developed and well-nourished. No distress.  HENT:  Head: Normocephalic and atraumatic. No trismus in the jaw.  Right Ear: Tympanic membrane and ear canal normal.  Left Ear: Tympanic membrane and ear canal normal.  Mouth/Throat: Uvula is midline, oropharynx is clear and moist and mucous membranes are normal. Dental caries present. No dental abscesses or edematous.    ttp of the right lower premolar with mild erythema and tenderness of the surrounding gums.  No fluctuance or obvious abscess at this time.  No trismus or facial edema.  Patient has mutiple dental caries  Neck: Normal range of motion. Neck supple.  Cardiovascular: Normal rate, regular rhythm and normal heart sounds.   No murmur heard. Pulmonary/Chest: Effort normal and breath sounds normal.  Musculoskeletal: Normal range of motion.  Lymphadenopathy:    He has no cervical adenopathy.  Neurological: He is alert and oriented to person, place, and time. He exhibits normal muscle tone. Coordination normal.  Skin: Skin is warm and dry.    ED Course   Procedures (including critical care time)  Labs Reviewed - No data to display   MDM    Pt has appt with his  dentist next Friday.  01/31/13.  No concerning sx's for Ludwig Angina.  Agrees to close f/u with is dentist.    Pt reviewed on the [place] narcotics database.VSS.  patient appears stable for discharge  Furman Trentman L. Trisha Mangle, PA-C 01/26/13 1252

## 2013-01-27 NOTE — ED Provider Notes (Signed)
Medical screening examination/treatment/procedure(s) were performed by non-physician practitioner and as supervising physician I was immediately available for consultation/collaboration.  [name], MD 01/27/13 1211 

## 2014-12-11 ENCOUNTER — Emergency Department (HOSPITAL_COMMUNITY)
Admission: EM | Admit: 2014-12-11 | Discharge: 2014-12-11 | Disposition: A | Discharge status: Home / Self Care | Payer: Self-pay | Attending: Emergency Medicine | Admitting: Emergency Medicine

## 2014-12-11 ENCOUNTER — Encounter (HOSPITAL_COMMUNITY): Payer: Self-pay | Admitting: Emergency Medicine

## 2014-12-11 DIAGNOSIS — E860 Dehydration: Secondary | ICD-10-CM

## 2014-12-11 DIAGNOSIS — R42 Dizziness and giddiness: Secondary | ICD-10-CM

## 2014-12-11 DIAGNOSIS — Z720 Tobacco use: Secondary | ICD-10-CM | POA: Insufficient documentation

## 2014-12-11 LAB — CBC WITH DIFFERENTIAL/PLATELET
BASOS ABS: 0 10*3/uL (ref 0.0–0.1)
BASOS PCT: 0 % (ref 0–1)
EOS PCT: 1 % (ref 0–5)
Eosinophils Absolute: 0.1 10*3/uL (ref 0.0–0.7)
HEMATOCRIT: 41.3 % (ref 39.0–52.0)
Hemoglobin: 13.8 g/dL (ref 13.0–17.0)
Lymphocytes Relative: 18 % (ref 12–46)
Lymphs Abs: 1.7 10*3/uL (ref 0.7–4.0)
MCH: 29.7 pg (ref 26.0–34.0)
MCHC: 33.4 g/dL (ref 30.0–36.0)
MCV: 88.8 fL (ref 78.0–100.0)
MONO ABS: 0.5 10*3/uL (ref 0.1–1.0)
Monocytes Relative: 5 % (ref 3–12)
NEUTROS ABS: 7 10*3/uL (ref 1.7–7.7)
Neutrophils Relative %: 76 % (ref 43–77)
PLATELETS: 177 10*3/uL (ref 150–400)
RBC: 4.65 MIL/uL (ref 4.22–5.81)
RDW: 12.8 % (ref 11.5–15.5)
WBC: 9.2 10*3/uL (ref 4.0–10.5)

## 2014-12-11 LAB — BASIC METABOLIC PANEL
Anion gap: 6 (ref 5–15)
BUN: 22 mg/dL — AB (ref 6–20)
CHLORIDE: 105 mmol/L (ref 101–111)
CO2: 28 mmol/L (ref 22–32)
CREATININE: 0.83 mg/dL (ref 0.61–1.24)
Calcium: 9.2 mg/dL (ref 8.9–10.3)
GFR calc non Af Amer: >60 mL/min (ref >60)
GLUCOSE: 140 mg/dL — AB (ref 65–99)
Potassium: 4 mmol/L (ref 3.5–5.1)
Sodium: 139 mmol/L (ref 135–145)

## 2014-12-11 MED ORDER — MECLIZINE HCL 25 MG PO TABS: 25.0000 mg | ORAL_TABLET | Freq: Three times a day (TID) | ORAL | Status: DC | PRN

## 2014-12-11 MED ORDER — MECLIZINE HCL 12.5 MG PO TABS
25.0000 mg | ORAL_TABLET | Freq: Once | ORAL | Status: AC
  Administered 2014-12-11: 25 mg via ORAL
  Filled 2014-12-11: qty 2

## 2014-12-11 MED ORDER — SODIUM CHLORIDE 0.9 % IV BOLUS (SEPSIS)
1000.0000 mL | Freq: Once | INTRAVENOUS | Status: AC
  Administered 2014-12-11: 1000 mL via INTRAVENOUS

## 2014-12-11 NOTE — Discharge Instructions (Signed)
Dehydration, Adult °Dehydration is when you lose more fluids from the body than you take in. Vital organs like the kidneys, brain, and heart cannot function without a proper amount of fluids and salt. Any loss of fluids from the body can cause dehydration.  °CAUSES  °· Vomiting. °· Diarrhea. °· Excessive sweating. °· Excessive urine output. °· Fever. °SYMPTOMS  °Mild dehydration °· Thirst. °· Dry lips. °· Slightly dry mouth. °Moderate dehydration °· Very dry mouth. °· Sunken eyes. °· Skin does not bounce back quickly when lightly pinched and released. °· Dark urine and decreased urine production. °· Decreased tear production. °· Headache. °Severe dehydration °· Very dry mouth. °· Extreme thirst. °· Rapid, weak pulse (more than 100 beats per minute at rest). °· Cold hands and feet. °· Not able to sweat in spite of heat and temperature. °· Rapid breathing. °· Blue lips. °· Confusion and lethargy. °· Difficulty being awakened. °· Minimal urine production. °· No tears. °DIAGNOSIS  °Your caregiver will diagnose dehydration based on your symptoms and your exam. Blood and urine tests will help confirm the diagnosis. The diagnostic evaluation should also identify the cause of dehydration. °TREATMENT  °Treatment of mild or moderate dehydration can often be done at home by increasing the amount of fluids that you drink. It is best to drink small amounts of fluid more often. Drinking too much at one time can make vomiting worse. Refer to the home care instructions below. °Severe dehydration needs to be treated at the hospital where you will probably be given intravenous (IV) fluids that contain water and electrolytes. °HOME CARE INSTRUCTIONS  °· Ask your caregiver about specific rehydration instructions. °· Drink enough fluids to keep your urine clear or pale yellow. °· Drink small amounts frequently if you have nausea and vomiting. °· Eat as you normally do. °· Avoid: °¨ Foods or drinks high in sugar. °¨ Carbonated  drinks. °¨ Juice. °¨ Extremely hot or cold fluids. °¨ Drinks with caffeine. °¨ Fatty, greasy foods. °¨ Alcohol. °¨ Tobacco. °¨ Overeating. °¨ Gelatin desserts. °· Wash your hands well to avoid spreading bacteria and viruses. °· Only take over-the-counter or prescription medicines for pain, discomfort, or fever as directed by your caregiver. °· Ask your caregiver if you should continue all prescribed and over-the-counter medicines. °· Keep all follow-up appointments with your caregiver. °SEEK MEDICAL CARE IF: °· You have abdominal pain and it increases or stays in one area (localizes). °· You have a rash, stiff neck, or severe headache. °· You are irritable, sleepy, or difficult to awaken. °· You are weak, dizzy, or extremely thirsty. °SEEK IMMEDIATE MEDICAL CARE IF:  °· You are unable to keep fluids down or you get worse despite treatment. °· You have frequent episodes of vomiting or diarrhea. °· You have blood or green matter (bile) in your vomit. °· You have blood in your stool or your stool looks black and tarry. °· You have not urinated in 6 to 8 hours, or you have only urinated a small amount of very dark urine. °· You have a fever. °· You faint. °MAKE SURE YOU:  °· Understand these instructions. °· Will watch your condition. °· Will get help right away if you are not doing well or get worse. °Document Released: 06/05/2005 Document Revised: 08/28/2011 Document Reviewed: 01/23/2011 °[employer]® Patient Information ©2015 [employer]. This information is not intended to replace advice given to you by your health care provider. Make sure you discuss any questions you have with your health care   provider.  Vertigo Vertigo means you feel like you or your surroundings are moving when they are not. Vertigo can be dangerous if it occurs when you are at work, driving, or performing difficult activities.  CAUSES  Vertigo occurs when there is a conflict of signals sent to your brain from the visual and sensory systems  in your body. There are many different causes of vertigo, including:  Infections, especially in the inner ear.  A bad reaction to a drug or misuse of alcohol and medicines.  Withdrawal from drugs or alcohol.  Rapidly changing positions, such as lying down or rolling over in bed.  A migraine headache.  Decreased blood flow to the brain.  Increased pressure in the brain from a head injury, infection, tumor, or bleeding. SYMPTOMS  You may feel as though the world is spinning around or you are falling to the ground. Because your balance is upset, vertigo can cause nausea and vomiting. You may have involuntary eye movements (nystagmus). DIAGNOSIS  Vertigo is usually diagnosed by physical exam. If the cause of your vertigo is unknown, your caregiver may perform imaging tests, such as an MRI scan (magnetic resonance imaging). TREATMENT  Most cases of vertigo resolve on their own, without treatment. Depending on the cause, your caregiver may prescribe certain medicines. If your vertigo is related to body position issues, your caregiver may recommend movements or procedures to correct the problem. In rare cases, if your vertigo is caused by certain inner ear problems, you may need surgery. HOME CARE INSTRUCTIONS   Follow your caregiver's instructions.  Avoid driving.  Avoid operating heavy machinery.  Avoid performing any tasks that would be dangerous to you or others during a vertigo episode.  Tell your caregiver if you notice that certain medicines seem to be causing your vertigo. Some of the medicines used to treat vertigo episodes can actually make them worse in some people. SEEK IMMEDIATE MEDICAL CARE IF:   Your medicines do not relieve your vertigo or are making it worse.  You develop problems with talking, walking, weakness, or using your arms, hands, or legs.  You develop severe headaches.  Your nausea or vomiting continues or gets worse.  You develop visual changes.  A  family member notices behavioral changes.  Your condition gets worse. MAKE SURE YOU:  Understand these instructions.  Will watch your condition.  Will get help right away if you are not doing well or get worse. Document Released: 03/15/2005 Document Revised: 08/28/2011 Document Reviewed: 12/22/2010 [facility] Patient Information 2015 [place], [place]. This information is not intended to replace advice given to you by your health care provider. Make sure you discuss any questions you have with your health care provider.

## 2014-12-11 NOTE — ED Notes (Signed)
At about 0830 today patient had sudden onset disequilibrium and slight nausea when turning vehicle into road at work.  He laid back in truck seat, falling asleep for approximately 2 hours. Upon awaking symptoms were still present but less severe.  Denies any SOB, chest pain, blurred or double vision, unilateral weakness, diminished hearing or fullness in ears.  States he went swimming yesterday and may have gotten water in ears.  Denies any recent URIs or other illnesses.

## 2014-12-11 NOTE — ED Notes (Signed)
Patient with no complaints at this time. Respirations even and unlabored. Skin warm/dry. Discharge instructions reviewed with patient at this time. Patient given opportunity to voice concerns/ask questions. IV removed per policy and band-aid applied to site. Patient discharged at this time and left Emergency Department with steady gait.  

## 2014-12-11 NOTE — ED Provider Notes (Signed)
CSN: [id number]     Arrival date & time 12/11/14  1140 History  This chart was scribed for Gilda Crease, MD by Phillis Haggis, ED Scribe. This patient was seen in room APA17/APA17 and patient care was started at 12:10 PM.   Chief Complaint  Patient presents with  . Dizziness   The history is provided by the patient. No language interpreter was used.    HPI Comments: Brandon Hunt is a 39 y.o. male who presents to the Emergency Department complaining of dizziness and lightheadedness onset earlier this morning. He states he was driving when he tried to make a right turn and began to feel woozy and "spinning headed." He states that it went away briefly but began again when he got to work. He states that he currently feels like "his head is floating." He states that he works outside. Pt reports history of sciatic nerve pain and it has been acting up since Wednesday when he was working outside; is asking for some pain medication for the symptoms. He denies having symptoms like this in the past.   Past Medical History  Diagnosis Date  . Back pain    Past Surgical History  Procedure Laterality Date  . Fracture surgery     History reviewed. No pertinent family history. History  Substance Use Topics  . Smoking status: Current Every Day Smoker -- 1.00 packs/day    Types: Cigarettes  . Smokeless tobacco: Not on file  . Alcohol Use: No    Review of Systems  Neurological: Positive for dizziness and light-headedness.  All other systems reviewed and are negative.  Allergies  Review of patient's allergies indicates no known allergies.  Home Medications   Prior to Admission medications   Not on File   BP 121/72 mmHg  Pulse 60  Temp(Src) 98.2 F (36.8 C) (Oral)  Resp 14  Ht 5\' 7"  (1.702 m)  Wt 130 lb (58.968 kg)  BMI 20.36 kg/m2  SpO2 99%   Physical Exam  Constitutional: He is oriented to person, place, and time. He appears well-developed and well-nourished. No  distress.  HENT:  Head: Normocephalic and atraumatic.  Right Ear: Hearing normal.  Left Ear: Hearing normal.  Nose: Nose normal.  Mouth/Throat: Oropharynx is clear and moist and mucous membranes are normal.  Eyes: Conjunctivae and EOM are normal. Pupils are equal, round, and reactive to light.  Neck: Normal range of motion. Neck supple.  Cardiovascular: Regular rhythm, S1 normal and S2 normal.  Exam reveals no gallop and no friction rub.   No murmur heard. Pulmonary/Chest: Effort normal and breath sounds normal. No respiratory distress. He exhibits no tenderness.  Abdominal: Soft. Normal appearance and bowel sounds are normal. There is no hepatosplenomegaly. There is no tenderness. There is no rebound, no guarding, no tenderness at McBurney's point and negative Murphy's sign. No hernia.  Musculoskeletal: Normal range of motion.  Some pain with ROM of left hip  Neurological: He is alert and oriented to person, place, and time. He has normal strength. No cranial nerve deficit or sensory deficit. Coordination normal. GCS eye subscore is 4. GCS verbal subscore is 5. GCS motor subscore is 6.  Extraocular muscle movement: normal No visual field cut Pupils: equal and reactive both direct and consensual response is normal No nystagmus present    Sensory function is intact to light touch, pinprick Proprioception intact  Grip strength 5/5 symmetric in upper extremities No pronator drift Normal finger to nose bilaterally  Lower extremity strength  5/5 against gravity Normal heel to shin bilaterally  Gait: normal   Skin: Skin is warm, dry and intact. No rash noted. No cyanosis.  Psychiatric: He has a normal mood and affect. His speech is normal and behavior is normal. Thought content normal.  Nursing note and vitals reviewed.   ED Course  Procedures (including critical care time) DIAGNOSTIC STUDIES: Oxygen Saturation is 99% on RA, normal by my interpretation.    COORDINATION OF  CARE: 12:13 PM-Discussed treatment plan which includes labs and fluids with pt at bedside and pt agreed to plan.    Labs Review Labs Reviewed  BASIC METABOLIC PANEL - Abnormal; Notable for the following:    Glucose, Bld 140 (*)    BUN 22 (*)    All other components within normal limits  CBC WITH DIFFERENTIAL/PLATELET    Imaging Review No results found.   EKG Interpretation None      MDM   Final diagnoses:  None   vertigo  Mild dehydration  Presents to the ER for evaluation of dizziness. Patient describes intermittent sensation of feeling like he is spinning. Symptoms began earlier today, he has not had previously. There is no headache, blurred vision. Patient has normal neurologic examination. No concern for intracranial abnormality. Patient does work in the heat, likely has some element of dehydration. The alignment was slightly elevated. Patient to minister to IV fluids and meclizine. Remainder of workup unremarkable. Will be discharge, oral hydration, symptomatically treatment for vertigo.  I personally performed the services described in this documentation, which was scribed in my presence. The recorded information has been reviewed and is accurate.       Gilda Crease, MD 12/11/14 1356

## 2014-12-11 NOTE — ED Notes (Signed)
Patient complaining of dizziness starting this morning. Denies pain.

## 2016-11-07 ENCOUNTER — Other Ambulatory Visit (HOSPITAL_COMMUNITY): Payer: Self-pay | Admitting: Family Medicine

## 2016-11-07 ENCOUNTER — Ambulatory Visit (HOSPITAL_COMMUNITY)
Admission: RE | Admit: 2016-11-07 | Discharge: 2016-11-07 | Disposition: A | Discharge status: Home / Self Care | Payer: Self-pay | Source: Ambulatory Visit | Attending: Family Medicine | Admitting: Family Medicine

## 2016-11-07 DIAGNOSIS — R52 Pain, unspecified: Secondary | ICD-10-CM

## 2016-11-07 DIAGNOSIS — G8929 Other chronic pain: Secondary | ICD-10-CM | POA: Insufficient documentation

## 2016-11-07 DIAGNOSIS — M549 Dorsalgia, unspecified: Secondary | ICD-10-CM | POA: Insufficient documentation

## 2016-11-28 ENCOUNTER — Other Ambulatory Visit (HOSPITAL_COMMUNITY): Payer: Self-pay | Admitting: Family Medicine

## 2016-11-28 DIAGNOSIS — G8929 Other chronic pain: Secondary | ICD-10-CM

## 2016-11-28 DIAGNOSIS — M5442 Lumbago with sciatica, left side: Principal | ICD-10-CM

## 2016-11-30 ENCOUNTER — Ambulatory Visit (HOSPITAL_COMMUNITY)
Admission: RE | Admit: 2016-11-30 | Discharge: 2016-11-30 | Disposition: A | Discharge status: Home / Self Care | Payer: Self-pay | Source: Ambulatory Visit | Attending: Family Medicine | Admitting: Family Medicine

## 2016-11-30 DIAGNOSIS — M5442 Lumbago with sciatica, left side: Secondary | ICD-10-CM | POA: Insufficient documentation

## 2016-11-30 DIAGNOSIS — G8929 Other chronic pain: Secondary | ICD-10-CM | POA: Insufficient documentation

## 2016-11-30 DIAGNOSIS — M5126 Other intervertebral disc displacement, lumbar region: Secondary | ICD-10-CM | POA: Insufficient documentation

## 2016-11-30 DIAGNOSIS — M48061 Spinal stenosis, lumbar region without neurogenic claudication: Secondary | ICD-10-CM | POA: Insufficient documentation

## 2016-11-30 DIAGNOSIS — M5127 Other intervertebral disc displacement, lumbosacral region: Secondary | ICD-10-CM | POA: Insufficient documentation

## 2017-07-22 IMAGING — DX DG LUMBAR SPINE COMPLETE 4+V
5 series · 5 of 5 positions shown · non-contrast
Comparison: Radiographs dated 09/25/2006

CLINICAL DATA: Chronic back pain.  Left hip pain.

EXAM:
LUMBAR SPINE - COMPLETE 4+ VIEW

[l-spine ap]
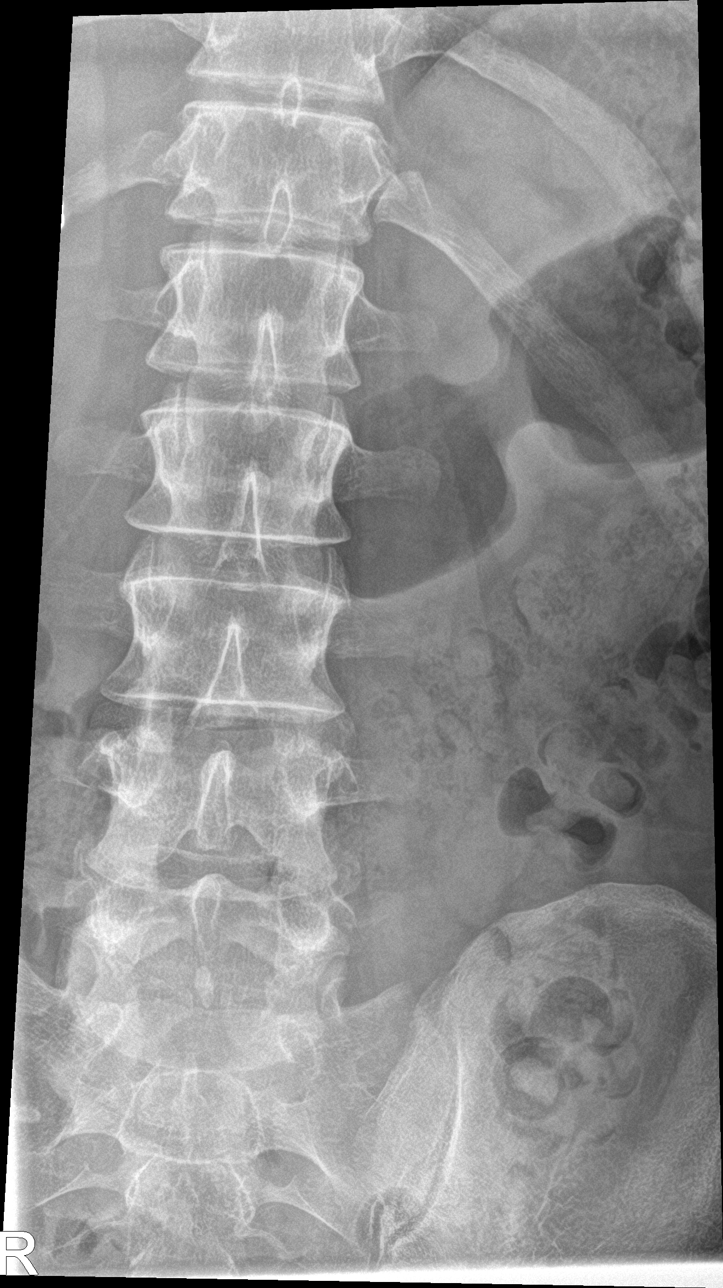

[l-spine obl (1 of 2)]
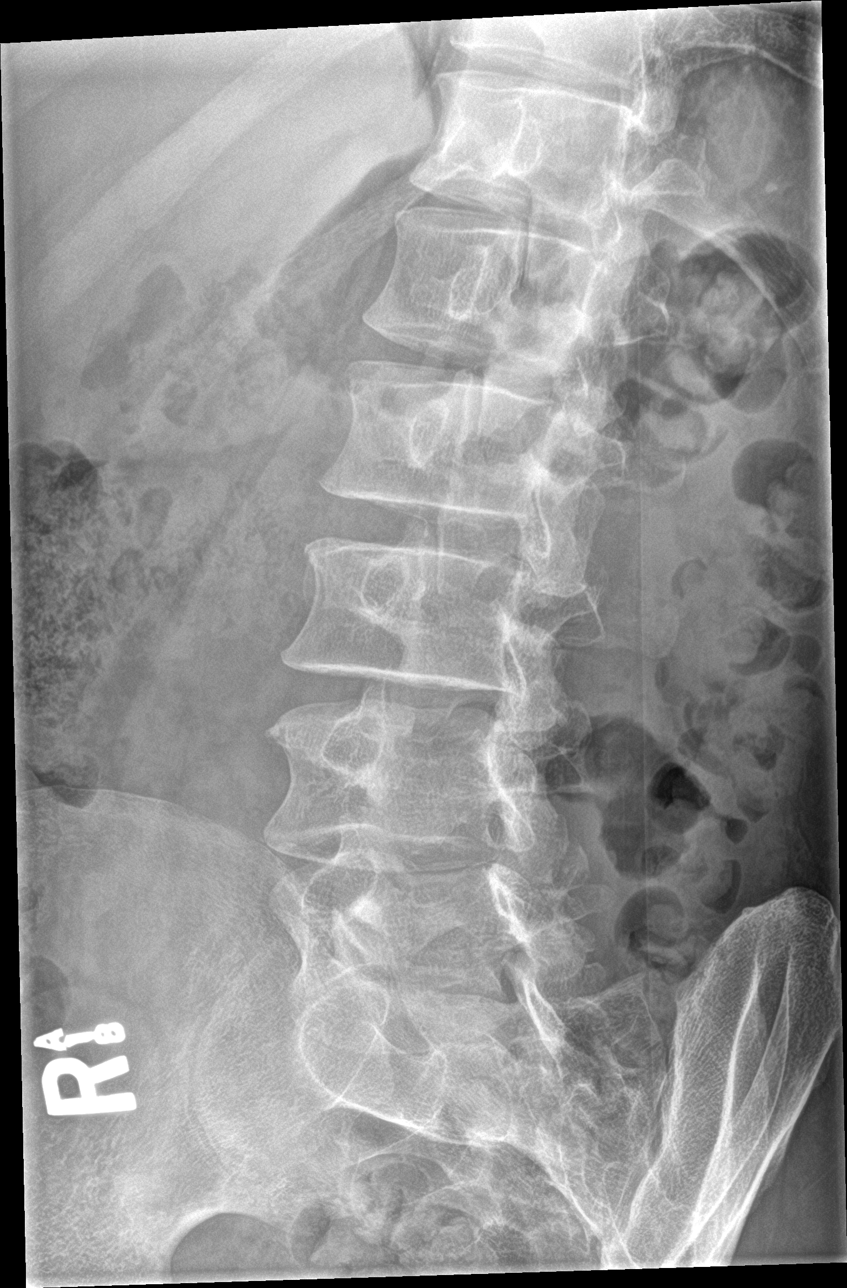

[l-spine obl (2 of 2)]
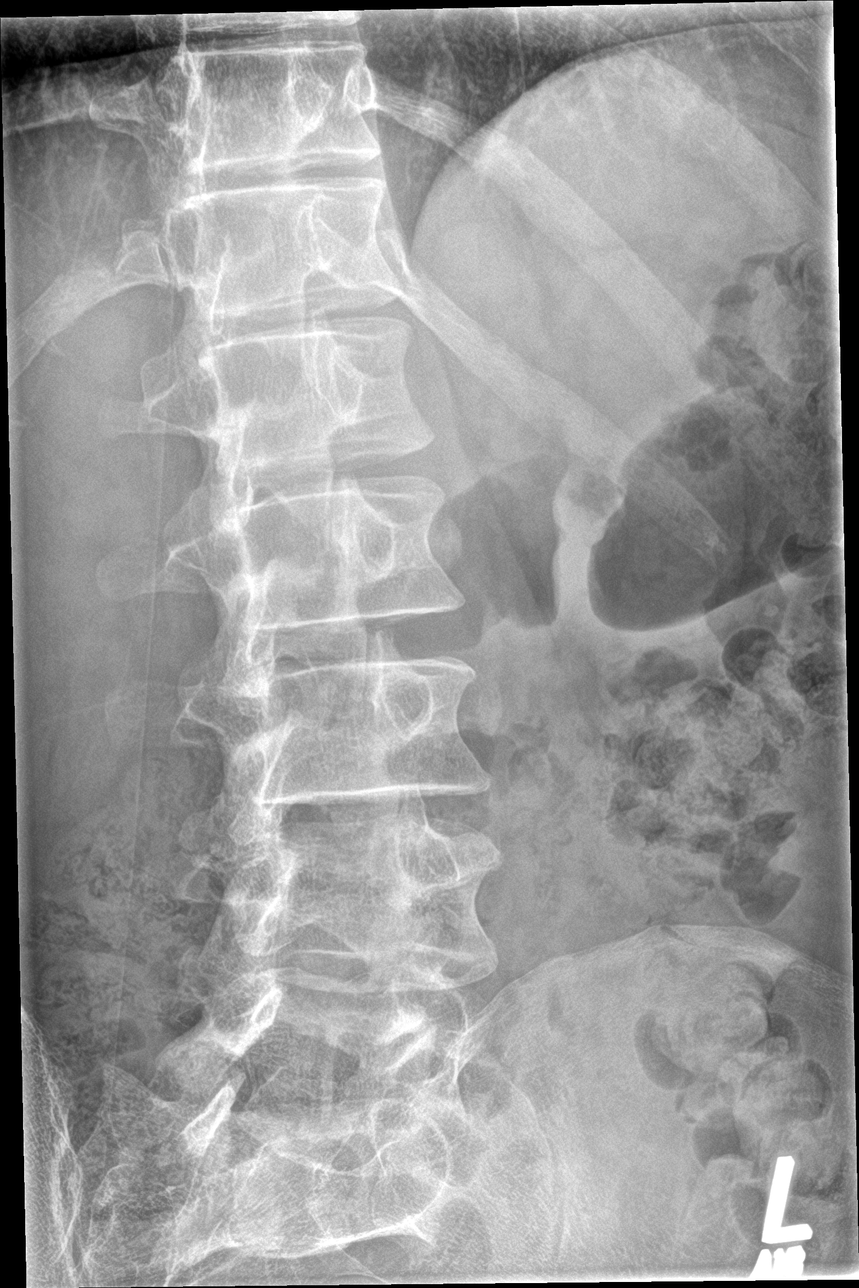

[l-spine lat]
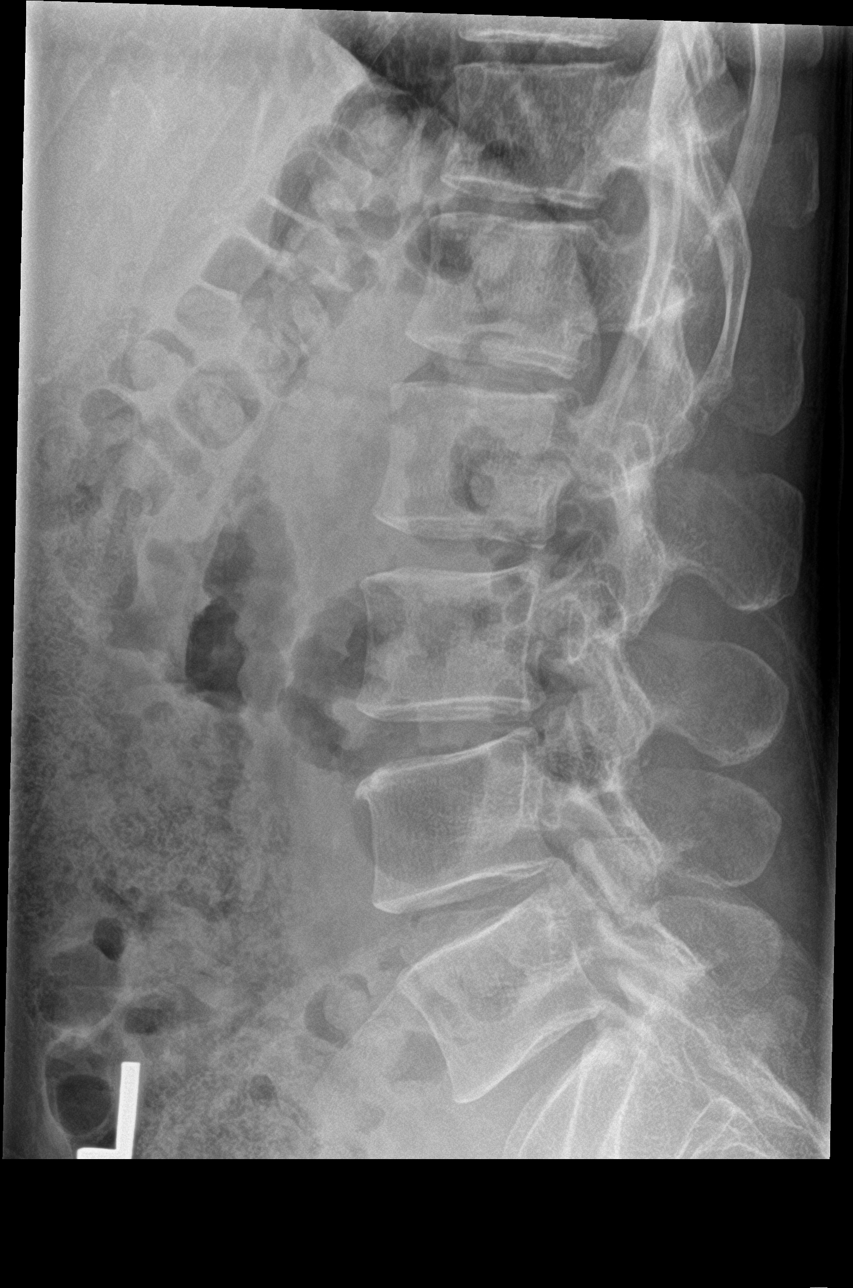

[l-spine spot]
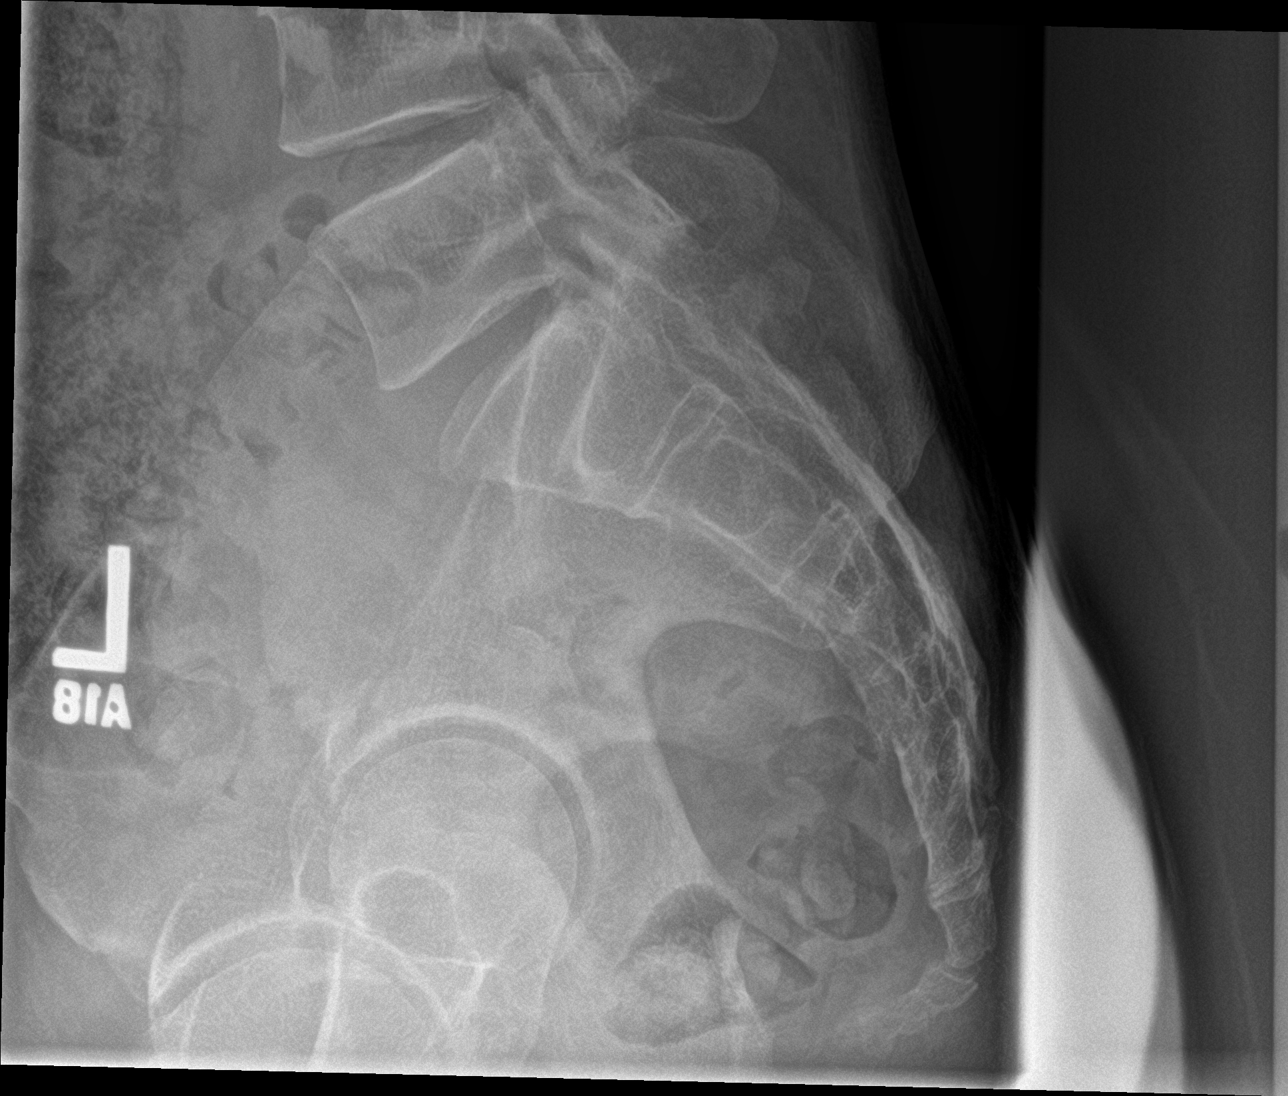

[5 of 5 positions shown; findings below may reference images not displayed]

FINDINGS: There is no evidence of lumbar spine fracture. Alignment is normal.
Intervertebral disc spaces are maintained. No facet arthritis.
IMPRESSION: Negative.

## 2018-09-26 ENCOUNTER — Encounter (HOSPITAL_COMMUNITY): Payer: Self-pay

## 2018-09-26 ENCOUNTER — Emergency Department (HOSPITAL_COMMUNITY): Payer: Self-pay

## 2018-09-26 ENCOUNTER — Other Ambulatory Visit: Payer: Self-pay

## 2018-09-26 ENCOUNTER — Emergency Department (HOSPITAL_COMMUNITY)
Admission: EM | Admit: 2018-09-26 | Discharge: 2018-09-26 | Disposition: A | Discharge status: Home / Self Care | Payer: Self-pay | Attending: Emergency Medicine | Admitting: Emergency Medicine

## 2018-09-26 DIAGNOSIS — R0789 Other chest pain: Secondary | ICD-10-CM | POA: Insufficient documentation

## 2018-09-26 DIAGNOSIS — F17210 Nicotine dependence, cigarettes, uncomplicated: Secondary | ICD-10-CM | POA: Insufficient documentation

## 2018-09-26 LAB — BASIC METABOLIC PANEL
Anion gap: 7 (ref 5–15)
BUN: 21 mg/dL — ABNORMAL HIGH (ref 6–20)
CO2: 27 mmol/L (ref 22–32)
Calcium: 9 mg/dL (ref 8.9–10.3)
Chloride: 105 mmol/L (ref 98–111)
Creatinine, Ser: 0.68 mg/dL (ref 0.61–1.24)
GFR calc Af Amer: >60 mL/min (ref >60)
GFR calc non Af Amer: >60 mL/min (ref >60)
Glucose, Bld: 94 mg/dL (ref 70–99)
Potassium: 3.9 mmol/L (ref 3.5–5.1)
Sodium: 139 mmol/L (ref 135–145)

## 2018-09-26 LAB — CBC WITH DIFFERENTIAL/PLATELET
Abs Immature Granulocytes: 0.02 10*3/uL (ref 0.00–0.07)
Basophils Absolute: 0.1 10*3/uL (ref 0.0–0.1)
Basophils Relative: 1 %
Eosinophils Absolute: 0.1 10*3/uL (ref 0.0–0.5)
Eosinophils Relative: 2 %
HCT: 40.9 % (ref 39.0–52.0)
Hemoglobin: 13.7 g/dL (ref 13.0–17.0)
Immature Granulocytes: 0 %
Lymphocytes Relative: 22 %
Lymphs Abs: 1.9 10*3/uL (ref 0.7–4.0)
MCH: 30.2 pg (ref 26.0–34.0)
MCHC: 33.5 g/dL (ref 30.0–36.0)
MCV: 90.1 fL (ref 80.0–100.0)
Monocytes Absolute: 0.6 10*3/uL (ref 0.1–1.0)
Monocytes Relative: 7 %
Neutro Abs: 5.7 10*3/uL (ref 1.7–7.7)
Neutrophils Relative %: 68 %
Platelets: 181 10*3/uL (ref 150–400)
RBC: 4.54 MIL/uL (ref 4.22–5.81)
RDW: 12.9 % (ref 11.5–15.5)
WBC: 8.4 10*3/uL (ref 4.0–10.5)
nRBC: 0 % (ref 0.0–0.2)

## 2018-09-26 LAB — D-DIMER, QUANTITATIVE (NOT AT ARMC): D-Dimer, Quant: 0.29 ug/mL-FEU (ref 0.00–0.50)

## 2018-09-26 LAB — TROPONIN I: Troponin I: <0.03 ng/mL (ref <0.03)

## 2018-09-26 MED ORDER — DICLOFENAC SODIUM 1 % TD GEL: 2.0000 g | Freq: Four times a day (QID) | TRANSDERMAL | 0 refills | Status: DC | PRN

## 2018-09-26 NOTE — Discharge Instructions (Signed)
We believe that your symptoms are caused by musculoskeletal strain.  Please read through the included information about additional care such as heating pads, over-the-counter pain medicine.  If you were provided a prescription please use it only as needed and as instructed.  Remember that early mobility and using the affected part of your body is actually better than keeping it immobile.  If your pain, shortness of breath, or other severe symptoms develop you should return to the ED immediately.   Follow-up with the doctor listed as recommended or return to the emergency department with new or worsening symptoms that concern you.

## 2018-09-26 NOTE — ED Triage Notes (Signed)
Pt presents to ED with complaints of upper back pain which started this am. Pt describes as stabbing pain. Pt denies injury. Pt denies any other symptoms.

## 2018-09-26 NOTE — ED Provider Notes (Signed)
Emergency Department Provider Note   I have reviewed the triage vital signs and the nursing notes.   HISTORY  Chief Complaint Back Pain   HPI Brandon Hunt is a 43 y.o. male with PMH of lower back pain presents to the emergency department for evaluation of acute onset right upper back pain.  Patient states that symptoms began abruptly this morning.  He describes sharp, severe pain initially without radiation.  He states that symptoms are worse with deep breathing.  He continues to have pain but only with breathing.  He does not feel specifically short of breath.  No anterior chest pain.  No fevers, chills, cough.  No abdominal pain.  Patient denies any injury.  No other symptoms.  No history of DVT/PE. Patient does smoke tobacco.    Past Medical History:  Diagnosis Date  . Back pain     There are no active problems to display for this patient.   Past Surgical History:  Procedure Laterality Date  . FRACTURE SURGERY      Allergies Patient has no known allergies.  No family history on file.  Social History Social History   Tobacco Use  . Smoking status: Current Every Day Smoker    Packs/day: 1.00    Types: Cigarettes  . Smokeless tobacco: Never Used  Substance Use Topics  . Alcohol use: No  . Drug use: No    Review of Systems  Constitutional: No fever/chills Eyes: No visual changes. ENT: No sore throat. Cardiovascular: Negative chest pain. Respiratory: Denies shortness of breath. Gastrointestinal: No abdominal pain.  No nausea, no vomiting.  No diarrhea.  No constipation. Genitourinary: Negative for dysuria. Musculoskeletal: Positive posterior right sided back pain just below the shoulder blade.  Skin: Negative for rash. Neurological: Negative for headaches, focal weakness or numbness.  10-point ROS otherwise negative.  ____________________________________________   PHYSICAL EXAM:  VITAL SIGNS: ED Triage Vitals  Enc Vitals Group     BP  09/26/18 1530 133/79     Pulse Rate 09/26/18 1530 (!) 54     Resp 09/26/18 1530 18     Temp 09/26/18 1530 98.1 F (36.7 C)     Temp Source 09/26/18 1530 Oral     SpO2 09/26/18 1530 99 %     Weight 09/26/18 1531 135 lb (61.2 kg)     Height 09/26/18 1531 5\' 7"  (1.702 m)     Pain Score 09/26/18 1530 7   Constitutional: Alert and oriented. Well appearing and in no acute distress. Eyes: Conjunctivae are normal. Head: Atraumatic. Nose: No congestion/rhinnorhea. Mouth/Throat: Mucous membranes are moist.  Neck: No stridor.   Cardiovascular: Normal rate, regular rhythm. Good peripheral circulation. Grossly normal heart sounds.   Respiratory: Normal respiratory effort.  No retractions. Lungs CTAB. Gastrointestinal: Soft and nontender. No distention.  Musculoskeletal: No lower extremity tenderness nor edema. No gross deformities of extremities. No focal tenderness over the thoracic spine, shoulder blades, or related musculature.  Neurologic:  Normal speech and language. No gross focal neurologic deficits are appreciated.  Skin:  Skin is warm, dry and intact. No rash noted.  ____________________________________________   LABS (all labs ordered are listed, but only abnormal results are displayed)  Labs Reviewed  BASIC METABOLIC PANEL - Abnormal; Notable for the following components:      Result Value   BUN 21 (*)    All other components within normal limits  CBC WITH DIFFERENTIAL/PLATELET  D-DIMER, QUANTITATIVE (NOT AT [facility])  TROPONIN I   ____________________________________________  EKG   EKG Interpretation  Date/Time:  Thursday September 26 2018 16:00:19 EDT Ventricular Rate:  56 PR Interval:    QRS Duration: 115 QT Interval:  428 QTC Calculation: 413 R Axis:   74 Text Interpretation:  Sinus rhythm Nonspecific intraventricular conduction delay Baseline wander in lead(s) V2 No STEMI.  Confirmed by Alona Bene,  [phone]) on 09/26/2018 4:07:26 PM        ____________________________________________  RADIOLOGY  Dg Chest 2 View  Result Date: 09/26/2018 CLINICAL DATA:  Right shoulder pain, stabbing sensation EXAM: CHEST - 2 VIEW COMPARISON:  None. FINDINGS: The heart size and mediastinal contours are within normal limits. Both lungs are clear. The visualized skeletal structures are unremarkable. IMPRESSION: No active cardiopulmonary disease. Electronically Signed   By: Deatra RobinsonKevin  Herman M.D.   On: 09/26/2018 16:42    ____________________________________________   PROCEDURES  Procedure(s) performed:   Procedures  None  ____________________________________________   INITIAL IMPRESSION / ASSESSMENT AND PLAN / ED COURSE  Pertinent labs & imaging results that were available during my care of the patient were reviewed by me and considered in my medical decision making (see chart for details).   Patient presents to the emergency department with acute onset pain underneath the right shoulder blade.  Pain is pleuritic in quality.  No hypoxemia.  Patient is actually bradycardic.  Likely musculoskeletal but will obtain lab work including d-dimer and chest x-ray. EKG ordered and pending but very low suspicion for ACS.   EKG with no acute/ischemic changes.  Lab work reviewed with no acute findings.  D-dimer and troponin are negative.  Chest x-ray reviewed with no infiltrate or other clear reason for the patient's discomfort.  He is low risk for PE by Wells criteria.  No indication for further imaging at this time.  Will treat for possible MSK etiology.  Discussed ED return precautions with the patient in detail. ____________________________________________  FINAL CLINICAL IMPRESSION(S) / ED DIAGNOSES  Final diagnoses:  Posterior chest pain    NEW OUTPATIENT MEDICATIONS STARTED DURING THIS VISIT:  New Prescriptions   DICLOFENAC SODIUM (VOLTAREN) 1 % GEL    Apply 2 g topically 4 (four) times daily as needed.    Note:  This document was prepared  using Dragon voice recognition software and may include unintentional dictation errors.  Alona Bene , MD Emergency Medicine    , Arlyss Repress G, MD 09/26/18 [phone]

## 2019-03-24 ENCOUNTER — Other Ambulatory Visit: Payer: Self-pay

## 2019-03-24 ENCOUNTER — Emergency Department (HOSPITAL_COMMUNITY)
Admission: EM | Admit: 2019-03-24 | Discharge: 2019-03-24 | Disposition: A | Discharge status: Home / Self Care | Payer: Self-pay | Attending: Emergency Medicine | Admitting: Emergency Medicine

## 2019-03-24 ENCOUNTER — Encounter (HOSPITAL_COMMUNITY): Payer: Self-pay

## 2019-03-24 DIAGNOSIS — F17210 Nicotine dependence, cigarettes, uncomplicated: Secondary | ICD-10-CM | POA: Insufficient documentation

## 2019-03-24 DIAGNOSIS — K0889 Other specified disorders of teeth and supporting structures: Secondary | ICD-10-CM | POA: Insufficient documentation

## 2019-03-24 MED ORDER — NAPROXEN 500 MG PO TABS: 500.0000 mg | ORAL_TABLET | Freq: Two times a day (BID) | ORAL | 0 refills | Status: AC

## 2019-03-24 MED ORDER — PENICILLIN V POTASSIUM 500 MG PO TABS: 500.0000 mg | ORAL_TABLET | Freq: Four times a day (QID) | ORAL | 0 refills | Status: AC

## 2019-03-24 NOTE — Discharge Instructions (Signed)
Call one of the dentists offices provided to schedule an appointment for re-evaluation and further management within the next 48 hours.  ° °I have prescribed you Penicillin which is an antibiotic to treat the infection and Naproxen which is an anti-inflammatory medicine to treat the pain.  ° °Please take all of your antibiotics until finished. You may develop abdominal discomfort or diarrhea from the antibiotic.  You may help offset this with probiotics which you can buy at the store (ask your pharmacist if unable to find) or get probiotics in the form of eating yogurt. Do not eat or take the probiotics until 2 hours after your antibiotic. If you are unable to tolerate these side effects follow-up with your primary care provider or return to the emergency department.  ° °If you begin to experience any blistering, rashes, swelling, or difficulty breathing seek medical care for evaluation of potentially more serious side effects.  ° °Be sure to eat something when taking the Naproxen as it can cause stomach upset and at worst stomach bleeding. Do not take additional non steroidal anti-inflammatory medicines such as Ibuprofen, Aleve, Advil, Mobic, Diclofenac, or goodie powder while taking Naproxen. You may supplement with Tylenol.  ° °We have prescribed you new medication(s) today. Discuss the medications prescribed today with your pharmacist as they can have adverse effects and interactions with your other medicines including over the counter and prescribed medications. Seek medical evaluation if you start to experience new or abnormal symptoms after taking one of these medicines, seek care immediately if you start to experience difficulty breathing, feeling of your throat closing, facial swelling, or rash as these could be indications of a more serious allergic reaction ° °If you start to experience and new or worsening symptoms return to the emergency department. If you start to experience fever, chills, neck  stiffness/pain, or inability to move your neck or open your mouth come back to the emergency department immediately.  ° °

## 2019-03-24 NOTE — ED Triage Notes (Signed)
Pt presents to ED with complaints of right upper dental pain. Pt states tooth broke off 2 days ago.

## 2019-03-24 NOTE — ED Provider Notes (Signed)
[facility] EMERGENCY DEPARTMENT Provider Note   CSN: [id number] Arrival date & time: 03/24/19  1957     History   Chief Complaint Chief Complaint  Patient presents with  . Dental Pain    HPI Brandon Hunt is a 43 y.o. male with a history of tobacco abuse who presents to the emergency department with complaints of dental pain for the past 2 days.  Patient states that he chipped part of his right upper lateral incisor a few days ago.  Initially was not having much pain but since then has developed discomfort.  It is constant, progressively worsening, currently a 5 out of 10 in severity, no alleviating or aggravating factors.  Denies fever, chills, intraoral drainage, dysphagia, trismus, drooling, or neck pain/stiffness.  He does not currently have a dentist.     HPI  Past Medical History:  Diagnosis Date  . Back pain     There are no active problems to display for this patient.   Past Surgical History:  Procedure Laterality Date  . FRACTURE SURGERY          Home Medications    Prior to Admission medications   Medication Sig Start Date End Date Taking? Authorizing Provider  ibuprofen (ADVIL) 200 MG tablet Take 800 mg by mouth daily as needed for mild pain or moderate pain.   Yes [provider]  oxyCODONE (OXY IR/ROXICODONE) 5 MG immediate release tablet Take 5 mg by mouth 3 (three) times daily. 02/28/19  Yes [provider]    Family History No family history on file.  Social History Social History   Tobacco Use  . Smoking status: Current Every Day Smoker    Packs/day: 1.00    Types: Cigarettes  . Smokeless tobacco: Never Used  Substance Use Topics  . Alcohol use: No  . Drug use: No     Allergies   Patient has no known allergies.   Review of Systems Review of Systems  Constitutional: Negative for chills and fever.  HENT: Positive for dental problem. Negative for congestion, drooling, ear pain, facial swelling, sore throat,  trouble swallowing and voice change.   Gastrointestinal: Negative for vomiting.  Musculoskeletal: Negative for neck pain and neck stiffness.   Physical Exam Updated Vital Signs BP 122/85 (BP Location: Right Arm)   Pulse 74   Temp 98.6 F (37 C) (Oral)   Resp 18   Ht 5\' 7"  (1.702 m)   Wt 60.8 kg   SpO2 100%   BMI 20.99 kg/m   Physical Exam Vitals signs and nursing note reviewed.  Constitutional:      General: He is not in acute distress.    Appearance: He is well-developed. He is not toxic-appearing.  HENT:     Head: Normocephalic and atraumatic.     Right Ear: Tympanic membrane is not perforated, erythematous, retracted or bulging.     Left Ear: Tympanic membrane is not perforated, erythematous, retracted or bulging.     Nose: Nose normal.     Mouth/Throat:     Pharynx: Uvula midline. No oropharyngeal exudate or posterior oropharyngeal erythema.      Comments: Patient has very poor dentition throughout with multiple decayed teeth and multiple absent teeth. Posterior oropharynx is symmetric appearing. Patient tolerating own secretions without difficulty. No trismus. No drooling. No hot potato voice. No swelling beneath the tongue, submandibular compartment is soft.   Eyes:     General:        Right eye: No discharge.  Left eye: No discharge.     Conjunctiva/sclera: Conjunctivae normal.  Neck:     Musculoskeletal: Normal range of motion and neck supple. Normal range of motion. No edema, erythema, neck rigidity, crepitus or pain with movement.  Lymphadenopathy:     Cervical: No cervical adenopathy.  Neurological:     Mental Status: He is alert.  Psychiatric:        Behavior: Behavior normal.        Thought Content: Thought content normal.      ED Treatments / Results  Labs (all labs ordered are listed, but only abnormal results are displayed) Labs Reviewed - No data to display  EKG None  Radiology No results found.  Procedures Procedures (including  critical care time)  Medications Ordered in ED Medications - No data to display   Initial Impression / Assessment and Plan / ED Course  I have reviewed the triage vital signs and the nursing notes.  Pertinent labs & imaging results that were available during my care of the patient were reviewed by me and considered in my medical decision making (see chart for details).    Patient presents with dental pain. Patient is nontoxic appearing, vitals without significant abnormality. No gross abscess.  Exam unconcerning for Ludwig's angina or spread of infection. Tooth damaged but no pulp exposed.  Will treat with Pen VK and Naproxen.  Urged patient to follow-up with dentist, dental resources were provided.  Discussed treatment plan and need for follow up as well as return precautions. Provided opportunity for questions, patient confirmed understanding and is agreeable to plan.   Final Clinical Impressions(s) / ED Diagnoses   Final diagnoses:  Pain, dental    ED Discharge Orders         Ordered    naproxen (NAPROSYN) 500 MG tablet  2 times daily     03/24/19 2207    penicillin v potassium (VEETID) 500 MG tablet  4 times daily     03/24/19 2207           Amaryllis Dyke, PA-C 03/24/19 2224    Maudie Flakes, MD 03/25/19 2247

## 2019-06-10 IMAGING — DX CHEST - 2 VIEW
2 series · 2 of 2 positions shown · non-contrast
Comparison: None.

CLINICAL DATA: Right shoulder pain, stabbing sensation

EXAM:
CHEST - 2 VIEW

[chest pa]
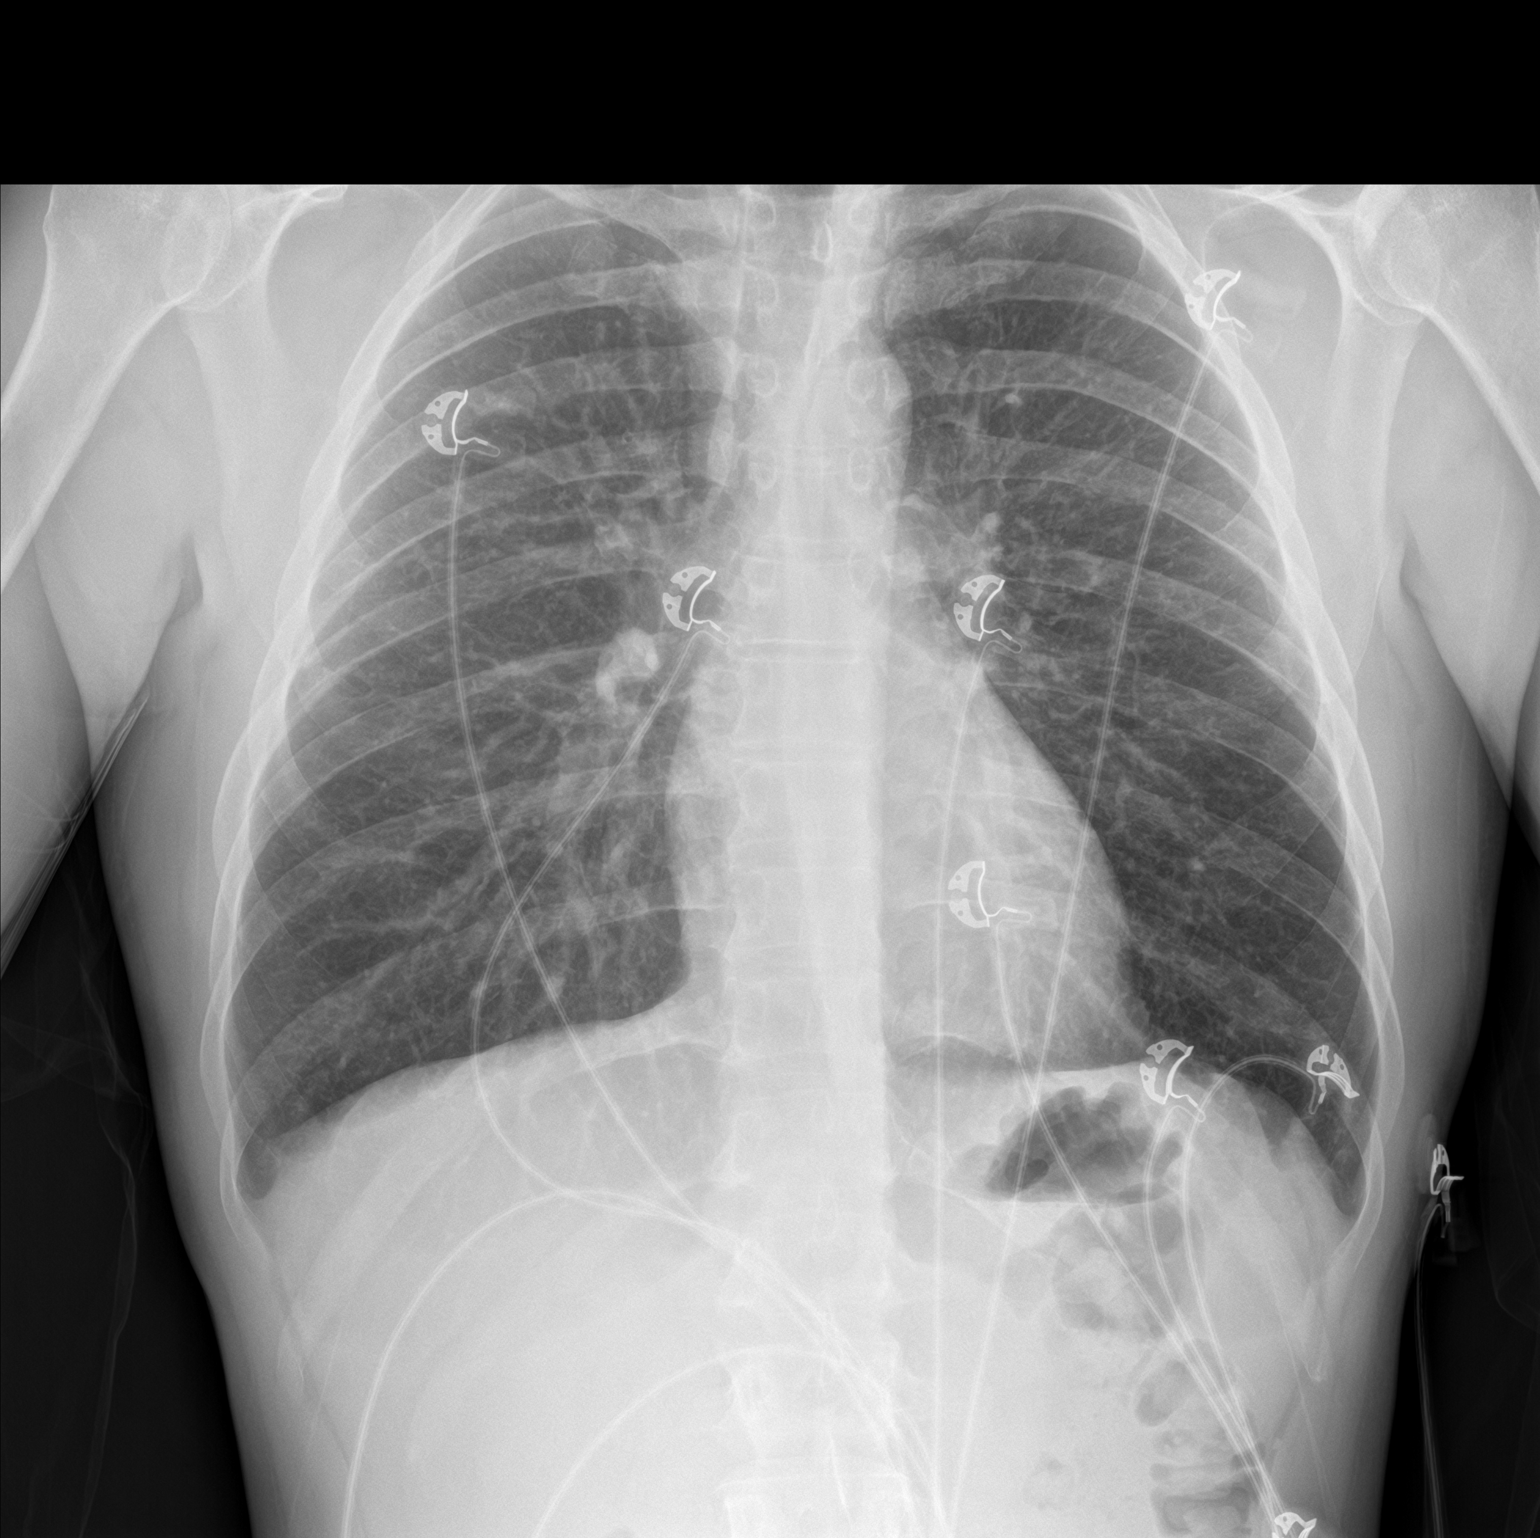

[chest lat]
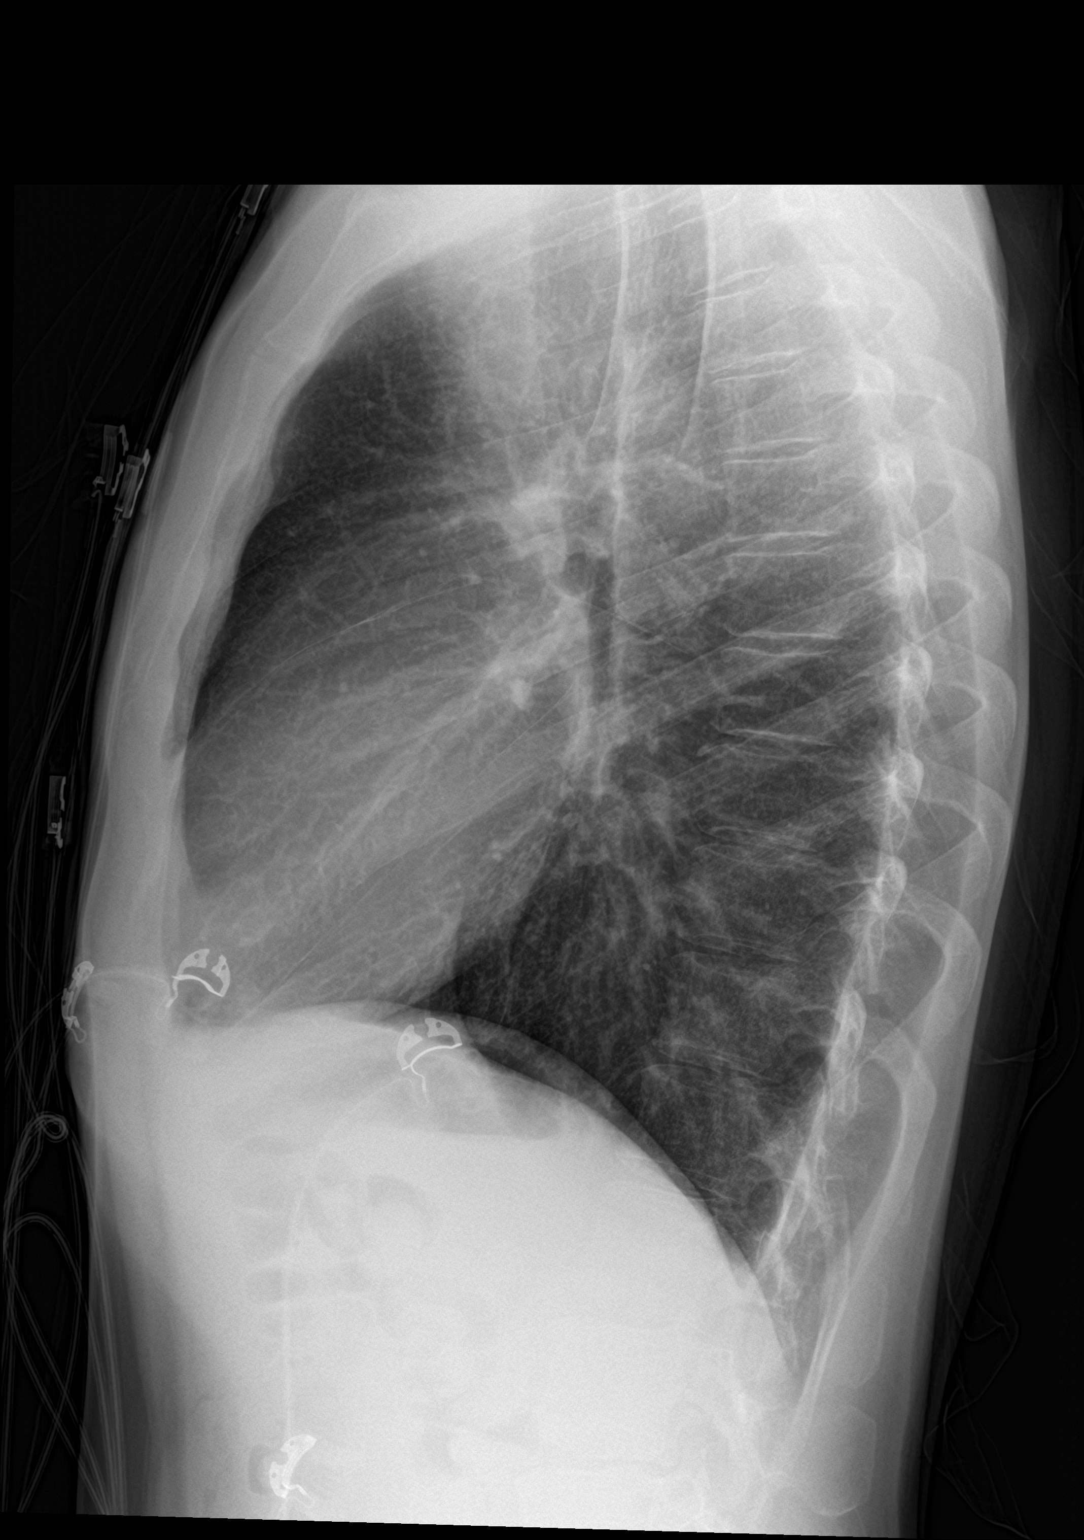

[2 of 2 positions shown; findings below may reference images not displayed]

FINDINGS: The heart size and mediastinal contours are within normal limits.
Both lungs are clear. The visualized skeletal structures are
unremarkable.
IMPRESSION: No active cardiopulmonary disease.

## 2020-07-29 ENCOUNTER — Other Ambulatory Visit: Payer: Self-pay

## 2020-07-29 ENCOUNTER — Emergency Department (HOSPITAL_COMMUNITY)
Admission: EM | Admit: 2020-07-29 | Discharge: 2020-07-29 | Disposition: A | Discharge status: Home / Self Care | Payer: Worker's Compensation | Attending: Emergency Medicine | Admitting: Emergency Medicine

## 2020-07-29 ENCOUNTER — Encounter (HOSPITAL_COMMUNITY): Payer: Self-pay | Admitting: Emergency Medicine

## 2020-07-29 DIAGNOSIS — Y990 Civilian activity done for income or pay: Secondary | ICD-10-CM | POA: Diagnosis not present

## 2020-07-29 DIAGNOSIS — Z23 Encounter for immunization: Secondary | ICD-10-CM | POA: Insufficient documentation

## 2020-07-29 DIAGNOSIS — W298XXA Contact with other powered powered hand tools and household machinery, initial encounter: Secondary | ICD-10-CM | POA: Diagnosis not present

## 2020-07-29 DIAGNOSIS — F17210 Nicotine dependence, cigarettes, uncomplicated: Secondary | ICD-10-CM | POA: Insufficient documentation

## 2020-07-29 DIAGNOSIS — S0502XA Injury of conjunctiva and corneal abrasion without foreign body, left eye, initial encounter: Secondary | ICD-10-CM

## 2020-07-29 MED ORDER — ERYTHROMYCIN 5 MG/GM OP OINT
TOPICAL_OINTMENT | Freq: Once | OPHTHALMIC | Status: AC
  Filled 2020-07-29: qty 3.5

## 2020-07-29 MED ORDER — OXYCODONE-ACETAMINOPHEN 5-325 MG PO TABS
2.0000 | ORAL_TABLET | Freq: Once | ORAL | Status: AC
  Administered 2020-07-29: 2 via ORAL
  Filled 2020-07-29: qty 2

## 2020-07-29 MED ORDER — TETRACAINE HCL 0.5 % OP SOLN
1.0000 [drp] | Freq: Once | OPHTHALMIC | Status: AC
  Administered 2020-07-29: 1 [drp] via OPHTHALMIC
  Filled 2020-07-29: qty 4

## 2020-07-29 MED ORDER — FLUORESCEIN SODIUM 1 MG OP STRP
1.0000 | ORAL_STRIP | Freq: Once | OPHTHALMIC | Status: AC
  Administered 2020-07-29: 1 via OPHTHALMIC
  Filled 2020-07-29: qty 1

## 2020-07-29 MED ORDER — TETANUS-DIPHTH-ACELL PERTUSSIS 5-2.5-18.5 LF-MCG/0.5 IM SUSY
0.5000 mL | PREFILLED_SYRINGE | Freq: Once | INTRAMUSCULAR | Status: AC
  Administered 2020-07-29: 0.5 mL via INTRAMUSCULAR
  Filled 2020-07-29: qty 0.5

## 2020-07-29 MED ORDER — ERYTHROMYCIN 5 MG/GM OP OINT: TOPICAL_OINTMENT | Freq: Four times a day (QID) | OPHTHALMIC | 0 refills | Status: AC

## 2020-07-29 MED ORDER — OXYCODONE-ACETAMINOPHEN 5-325 MG PO TABS: 2.0000 | ORAL_TABLET | ORAL | 0 refills | Status: AC | PRN

## 2020-07-29 NOTE — ED Notes (Signed)
Emergency line called. Left message for Ophthalmologist to call Dr Clayborne Dana in the ER @ [phone]

## 2020-07-29 NOTE — ED Triage Notes (Signed)
Pt states he picked wand up from pressure washer pulling trigger and it twisted striking corner of left eye. Pt denies any visual changes.
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
## 2020-07-29 NOTE — ED Notes (Signed)
ED Provider at bedside.
